# Patient Record
Sex: Male | Born: 1960 | Race: Black or African American | Hispanic: No | Smoking: Never smoker
Health system: Southern US, Community
[De-identification: ages and names within clinical notes are randomized; demographics above are authoritative.]

## PROBLEM LIST (undated history)

## (undated) DIAGNOSIS — S62309A Unspecified fracture of unspecified metacarpal bone, initial encounter for closed fracture: Secondary | ICD-10-CM

## (undated) DIAGNOSIS — I10 Essential (primary) hypertension: Secondary | ICD-10-CM

## (undated) DIAGNOSIS — N200 Calculus of kidney: Secondary | ICD-10-CM

## (undated) DIAGNOSIS — R569 Unspecified convulsions: Secondary | ICD-10-CM

## (undated) HISTORY — PX: NASAL SINUS SURGERY: SHX719

---

## 1998-06-08 ENCOUNTER — Emergency Department (HOSPITAL_COMMUNITY): Admission: EM | Admit: 1998-06-08 | Discharge: 1998-06-08 | Payer: Self-pay | Admitting: Emergency Medicine

## 1998-06-13 ENCOUNTER — Emergency Department (HOSPITAL_COMMUNITY): Admission: EM | Admit: 1998-06-13 | Discharge: 1998-06-13 | Payer: Self-pay | Admitting: Emergency Medicine

## 1998-06-27 ENCOUNTER — Ambulatory Visit (HOSPITAL_COMMUNITY): Admission: RE | Admit: 1998-06-27 | Discharge: 1998-06-27 | Payer: Self-pay | Admitting: Obstetrics and Gynecology

## 2005-05-01 ENCOUNTER — Emergency Department (HOSPITAL_COMMUNITY): Admission: EM | Admit: 2005-05-01 | Discharge: 2005-05-01 | Payer: Self-pay | Admitting: Emergency Medicine

## 2005-05-02 ENCOUNTER — Inpatient Hospital Stay (HOSPITAL_COMMUNITY): Admission: EM | Admit: 2005-05-02 | Discharge: 2005-05-19 | Payer: Self-pay | Admitting: Emergency Medicine

## 2005-05-02 ENCOUNTER — Encounter (INDEPENDENT_AMBULATORY_CARE_PROVIDER_SITE_OTHER): Payer: Self-pay | Admitting: *Deleted

## 2005-05-07 ENCOUNTER — Encounter: Payer: Self-pay | Admitting: *Deleted

## 2007-03-19 ENCOUNTER — Emergency Department (HOSPITAL_COMMUNITY): Admission: EM | Admit: 2007-03-19 | Discharge: 2007-03-19 | Payer: Self-pay | Admitting: Emergency Medicine

## 2010-08-09 ENCOUNTER — Encounter: Payer: Self-pay | Admitting: Family Medicine

## 2010-12-04 NOTE — Procedures (Signed)
EEG NUMBER:  [id number]   REFERRING PHYSICIAN:  Derenda Mis, M.D.   CLINICAL HISTORY:  This is a 50 year old male with short-term memory loss,  confusion and seizures.  Patient has been evaluated for possible herpes  encephalitis.   MEDICATIONS LISTED:  Lovenox, Seroquel, Mycostatin, Tylenol, Depakote,  Ativan.   DESCRIPTION:  This is a 17-channel portable EEG recorded with the patient  described as awake and confused.  The background awake rhythm consists of 9-  10 Hz alpha which is of moderate amplitude, synchronous, reactive to eye  opening and closure.  High frequency low amplitude beta activity is seen  throughout the tracing may be a medication effect.  No paroxysmal  epileptiform activity, spikes or sharp waves are seen.  No periodic  lateralized epileptiform discharges are noted either.  There is mild  intermittent theta and delta slowing seen diffusely intermittently.  Length  of the tracing is 20.5 minutes.  Technical component is average.  EKG  tracing reveals sinus rhythm.  Hyperventilation and photic stimulation are  not performed.   IMPRESSION:  This EEG performed in wakeful states is mildly abnormal due to  mild bihemispheric slowing which is a nonspecific finding.  No definite  epileptiform activity or periodic lateralized epileptiform discharges are  seen.  Excessive beta activity seen which may be medication effect.           ______________________________  Sunny Schlein. Pearlean Brownie, MD     FAO:ZHYQ  D:  05/04/2005 17:39:00  T:  05/04/2005 21:39:42  Job #:  [id number]

## 2010-12-04 NOTE — Procedures (Signed)
EEG:  [id number].   CLINICAL HISTORY:  A 49 year old with severe memory loss and five to six  seizure. Study is being done to look for presence of seizure disorder.   PROCEDURE:  The tracing is carried out on a 32-digital Cadwell recorder  reformatted into 16 channel montages with one devoted to EKG. The patient  was awake during the recording. Medications include Ativan, Depakote,  Seroquel, Lovenox. International 10/20 system lead placement was used.   DESCRIPTION OF FINDINGS:  Background is a continuous mixture of 10-11 Hz, 20  microvolt alpha range activity with superimposed generalized 13 Hz beta  range activity of similar voltage.   The patient does not change state of arousal. There is no focal slowing.  There is no interictal epileptiform activity in the form of spikes or sharp  waves. Activating procedures were not carried out. EKG showed a regular  sinus rhythm with ventricular response of 96 beats per minute.   IMPRESSION:  Essentially normal EEG, excessive beta range activity related  to use of Ativan. No evidence of focal slowing or seizures.      Deanna Artis. Sharene Skeans, M.D.  Electronically Signed     EAV:[id number]  D:  05/03/2005 21:49:10  T:  05/04/2005 00:11:58  Job #:  [id number]   cc:   Melissa L. Ladona Ridgel, MD   Melvyn Novas, M.D.  Fax: [phone]

## 2010-12-04 NOTE — H&P (Signed)
[name], [name] NO.:  [id number]   MEDICAL RECORD NO.:  [id number]          PATIENT TYPE:  INP   LOCATION:  2906                         FACILITY:  [facility]   PHYSICIAN:  Sherin Quarry, MD      DATE OF BIRTH:  07/13/1961   DATE OF ADMISSION:  05/02/2005  DATE OF DISCHARGE:                                HISTORY & PHYSICAL   HISTORY OF PRESENT ILLNESS:  John Mcgee is a 50 year old man who  presents to [facility] Emergency Room tonight after being witnessed to have  several seizures. These seizures were described as generalized tonic clonic  movements associated with loss of bladder control. On arrival to the  emergency room, he was initially given 2 mg of IV Ativan and this dosage was  repeated x1. The patient continued to have brief episodes of seizure  activity and therefore at 2 a.m. he was given 1 g of Dilantin as a loading  dose. Laboratory studies were obtained. These included alcohol level which  was negative, electrolytes which were notable for acidosis, blood sugar of  191, creatinine of 1.9. Liver functions were normal. Hemoglobin was 13.8.  Urinalysis was notable for large blood which may have been related to  placing the Foley catheter. Drug screen was negative. At this point the  patient is postictal and is somewhat agitated. Past medical history is very  significant. The patient's wife indicates that he may have been experiencing  memory loss for as long as 5 years. During that period of time he would  often state that he was worried that he would become lost if he would go  somewhere by himself. He had difficulty holding a job apparently because of  difficulty recalling detailed information. They had most recently been  living in [place] for 2 years before coming back to [place] in April. On  September 28, he was fired from his job possibly because of difficulty with  memory. Since that time his wife has observed that he would tend to say  things over and over again as if he did not remember he had said them. He  has complained on a couple of occasions of a dull headache. She has also  noted that he has been increasingly angry and aggressive toward her and the  children and has experienced mood swings. The wife brought him to see Dr.  Theresia Lo who performed blood testing which was negative. MRI scan of the  brain was obtained and this was reportedly normal. The patient went to a  neurologist in [facility]. The neurologist reviewed the MRI scan, ordered a  B12 and folate and placed him on Razadyne 8 mg daily. To the best of his  wife's knowledge he has not had any visual problems, he has not been falling  or ataxic, he has not had any breathing difficulty, chest pain, nausea or  vomiting. He will drink no more than one beer per day. He does not use drugs  and has never used drugs. He does not take any medications except those  described. 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 He has told his wife  in the past this involves exposure to some toxic materials.   PAST MEDICAL HISTORY:  He does not generally take any medications. He has no  known drug allergies. To the best of his wife's knowledge the only  significant surgical procedures he has had was a lithotripsy and also about  a year ago he had a large lipoma removed from his neck in [place]. She is  not aware of him having any other medical illnesses.   FAMILY HISTORY:  He has eight brothers and sisters one of whom has a seizure  history. His mother and father are both deceased from heart disease. He has  two children ages three and six.   SOCIAL HISTORY:  See above.   REVIEW OF SYSTEMS:  Head - He denies headache or dizziness; there is no  history of visual blurring; no history of tinnitus. There is no history of  breathing difficulty or chest pain. No history of nausea, vomiting or  abdominal pain. No history of diarrhea. No history of dysuria.   PHYSICAL  EXAMINATION:  GENERAL:  On physical exam he is a confused gentleman  who is moving his arms and legs. He is somewhat agitated. He has been  sedated with Ativan.  HEENT:  Exam is within normal limits. The pupils are equal and reactive.  There is no meningismus.  CHEST:  Clear.  BACK:  Examination the back reveals no CVA or point tenderness.  CARDIOVASCULAR:  Exam reveals normal S1-S2 without rubs, murmurs or gallops.  ABDOMEN:  The abdomen is benign.  NEUROLOGIC:  On neurologic testing cranial nerves, motor, sensory and  cerebellar testing is within normal limits grossly.  EXTREMITIES:  Examination of extremities reveals no evidence of  cyanosis or  edema.   IMPRESSION:  1.  New onset of recurrent seizures, status epilepticus.  2.  Five-year history of memory loss and behavior change as described of      unknown etiology.  3.  History of kidney stones.   PLAN:  The plan will be to obtain repeat MRI scan. Note that a CT scan has  already been obtained. Lumbar puncture will be obtained and fluid will be  sent for appropriate studies - T4, RPR, B12, folate, HIV serologies. The  patient should probably also have 24 urine's for poriferans and heavy  metals. Neurology consult will be obtained. We will continue Dilantin and  follow levels closely. We will administer Ativan as necessary to achieve  adequate sedation.           ______________________________  Sherin Quarry, MD     SY/MEDQ  D:  05/02/2005  T:  05/02/2005  Job:  [id number]   cc:   Vikki Ports, M.D.  Fax: [phone]
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
## 2010-12-04 NOTE — Consult Note (Signed)
NAMEPRATHAM, CASSATT               ACCOUNT NO.:  [id number]   MEDICAL RECORD NO.:  [id number]          PATIENT TYPE:  INP   LOCATION:  2906                         FACILITY:  [facility]   PHYSICIAN:  Melvyn Novas, M.D.  DATE OF BIRTH:  Jun 18, 1961   DATE OF CONSULTATION:  05/02/2005  DATE OF DISCHARGE:                                   CONSULTATION   REASON FOR CONSULTATION:  The patient was admitted on May 02, 2005 at  4:40 a.m. after having suffered 4 or 5 seizures, some of them observed by  the ER physician, Dr. Preston Fleeting.  Mr. Chase Picket has a history of memory loss, but  recently became more evident and has concerned his wife and family.  He had  one Friday prior to his admission here on Sunday morning and evaluation at  [facility] (Dr. Jesse Sans) and the patient's family reports that a  mini-mental test was done; but they heard no results and no comments of what  it showed.  The patient's progressive memory loss might have started as long  as 2 years ago and recently he deteriorated further; had visual  hallucinations, reported he hears engines and moving parts in the room, and  especially on the ceiling; he also had auditory hallucinations.  According  to his wife, consultations have never taken place.  The wife brought him to  the ER already on Saturday and apparently at that time he was just confused  and appeared to the ER physician demented.  After a blood test was obtained  he developed seizures, and then instead of being discharged home was  admitted to the [facility]. He was treated by Dr. Preston Fleeting with 4 mg of  Ativan and a gram of Dilantin IV; appears still somewhat somnolent and  postictal. He is drowsy and difficult to arouse, but pleasant and in no  acute distress. His vital signs are stable. The patient's CT in the ER was  normal. The patient had an MRI that was done with [facility] at [facility],  [place] -- reportedly was normal, although  I do not have a  written report nor  access to the pictures.   PHYSICAL EXAMINATION:  LUNGS:  Lungs are clear to auscultation.  CARDIAC:  Regular rate and rhythm.  HEENT:  The patient's temporal arteries are not impaired. He does not have  signs of a tongue bite at this point.  NECK:  Supple.  NEUROLOGIC:  Again, he is arousable but only after sternal rub and/or pinch.  Pupils react equal to light and accommodation. He has full extraocular  movements. No facial asymmetry. No tongue deviation. The uvula is midline.  The speech is clear and fluent, but the patient fell asleep multiple times  during this exam. He shows normal motor function with full grip strength  bilaterally. Both arms can be lifted against gravity with equal strength,  tone and mass. Deep tendon reflexes are 1+. There was no clonus, no  spasticity.  Downgoing toes to plantar stimulation. Sensory shows full touch  sensation to pinprick and vibratory sense. Coordination was also tested, but  the patient fell asleep when I explained the finger-nose test to him. I  could arouse him so far as to try to move with his finger my moving finger  and cooperated well with that.  He still seems to be very sedated.   LABORATORY DATA:  His labs have returned; White blood cell count of 17.4.  Chem-7 was normal, except for elevated creatinine of 1.6. CSF showed  multiple red blood cells but no white blood cells.  This traumatic tap might  be responsible for the elevated glucose and protein.  The patient appears to  be not fully aware of his surroundings, and defers all questions and medical  information to his wife.   FAMILY HISTORY:  Apparently is negative for mental illness or neurologic  degenerative illnesses; also there is no history of seizures.   SOCIAL HISTORY:  The patient is married and lives with his wife; family is  in the area. He is not on medications for hallucination at this point.  His  primary care service, the hospital team  continued Ativan and Dilantin.   ASSESSMENT:  1.  The patient is oversedated or is still postictal.  EEG will be ordered      for Monday. There is no acute local mental status change since the      admission yesterday. I would like to switch him from an IV Dilantin to      Depakote 1 gram a day divided in 500 mg cm b.i.d. This might also help      his psychiatric illness which are strongly suspect is present.  I would      like to add Seroquel 100 mg q.h.s. and obtain a psychiatric consult with      Dr. Antonietta Breach.  The patient has visual and auditory      hallucinations; we have to further evaluate him for substance abuse,      although a toxicology screen in the ER was negative.  I expect a      discharge or transfer to mental health within 48 hours,           ______________________________  Melvyn Novas, M.D.     CD/MEDQ  D:  05/02/2005  T:  05/02/2005  Job:  [id number]

## 2012-01-10 ENCOUNTER — Other Ambulatory Visit: Payer: Self-pay | Admitting: Nephrology

## 2012-02-26 ENCOUNTER — Emergency Department (HOSPITAL_COMMUNITY)
Admission: EM | Admit: 2012-02-26 | Discharge: 2012-02-26 | Disposition: A | Discharge status: Home / Self Care | Payer: 59 | Attending: Emergency Medicine | Admitting: Emergency Medicine

## 2012-02-26 ENCOUNTER — Emergency Department (HOSPITAL_COMMUNITY): Payer: 59

## 2012-02-26 ENCOUNTER — Encounter (HOSPITAL_COMMUNITY): Payer: Self-pay | Admitting: *Deleted

## 2012-02-26 DIAGNOSIS — R51 Headache: Secondary | ICD-10-CM

## 2012-02-26 DIAGNOSIS — I10 Essential (primary) hypertension: Secondary | ICD-10-CM | POA: Insufficient documentation

## 2012-02-26 DIAGNOSIS — R42 Dizziness and giddiness: Secondary | ICD-10-CM | POA: Insufficient documentation

## 2012-02-26 HISTORY — DX: Unspecified convulsions: R56.9

## 2012-02-26 LAB — URINALYSIS, ROUTINE W REFLEX MICROSCOPIC
Bilirubin Urine: NEGATIVE
Glucose, UA: NEGATIVE mg/dL
Hgb urine dipstick: NEGATIVE
Ketones, ur: 15 mg/dL — AB
Nitrite: NEGATIVE
Protein, ur: NEGATIVE mg/dL
Specific Gravity, Urine: 1.031 — ABNORMAL HIGH (ref 1.005–1.030)
Urobilinogen, UA: 1 mg/dL (ref 0.0–1.0)
pH: 6 (ref 5.0–8.0)

## 2012-02-26 LAB — CBC WITH DIFFERENTIAL/PLATELET
Basophils Absolute: 0 10*3/uL (ref 0.0–0.1)
Basophils Relative: 0 % (ref 0–1)
Eosinophils Absolute: 0.1 10*3/uL (ref 0.0–0.7)
Eosinophils Relative: 1 % (ref 0–5)
HCT: 44.6 % (ref 39.0–52.0)
Lymphs Abs: 2.1 10*3/uL (ref 0.7–4.0)
MCH: 29.5 pg (ref 26.0–34.0)
MCHC: 33 g/dL (ref 30.0–36.0)
MCV: 89.6 fL (ref 78.0–100.0)
Monocytes Absolute: 0.6 10*3/uL (ref 0.1–1.0)
Monocytes Relative: 7 % (ref 3–12)
Neutro Abs: 6 10*3/uL (ref 1.7–7.7)
Neutrophils Relative %: 68 % (ref 43–77)
Platelets: 288 10*3/uL (ref 150–400)
RBC: 4.98 MIL/uL (ref 4.22–5.81)

## 2012-02-26 LAB — POCT I-STAT TROPONIN I: Troponin i, poc: 0 ng/mL (ref 0.00–0.08)

## 2012-02-26 LAB — POCT I-STAT, CHEM 8
BUN: 15 mg/dL (ref 6–23)
Calcium, Ion: 1.2 mmol/L (ref 1.12–1.23)
Chloride: 105 mEq/L (ref 96–112)
Creatinine, Ser: 1.1 mg/dL (ref 0.50–1.35)
Hemoglobin: 15 g/dL (ref 13.0–17.0)
Sodium: 142 mEq/L (ref 135–145)
TCO2: 23 mmol/L (ref 0–100)

## 2012-02-26 MED ORDER — HYDROCHLOROTHIAZIDE 25 MG PO TABS: 25.0000 mg | ORAL_TABLET | Freq: Every day | ORAL | Status: DC

## 2012-02-26 MED ORDER — SODIUM CHLORIDE 0.9 % IV BOLUS (SEPSIS): 1000.0000 mL | Freq: Once | INTRAVENOUS | Status: DC

## 2012-02-26 NOTE — ED Notes (Signed)
Pt given turkey sandwich

## 2012-02-26 NOTE — ED Provider Notes (Signed)
History     CSN: [id number]  Arrival date & time 02/26/12  1433   First MD Initiated Contact with Patient 02/26/12 1948      Chief Complaint  Patient presents with  . Dizziness  . Hypertension    (Consider location/radiation/quality/duration/timing/severity/associated sxs/prior treatment) Patient is a 51 y.o. male presenting with headaches. The history is provided by the patient.  Headache  This is a new problem. Episode onset: 3 days ago. Episode frequency: intermittently. The problem has been rapidly improving. The headache is associated with nothing. The pain is located in the frontal region. The quality of the pain is described as dull. The pain is at a severity of 3/10. The pain is mild. The pain does not radiate. Pertinent negatives include no fever, no chest pressure, no near-syncope, no orthopnea, no palpitations, no shortness of breath, no nausea and no vomiting. He has tried nothing for the symptoms.    Past Medical History  Diagnosis Date  . Seizures     History reviewed. No pertinent past surgical history.  No family history on file.  History  Substance Use Topics  . Smoking status: Never Smoker   . Smokeless tobacco: Not on file  . Alcohol Use: Yes     occ      Review of Systems  Constitutional: Negative for fever and chills.  HENT: Negative for congestion and rhinorrhea.   Respiratory: Negative for cough and shortness of breath.   Cardiovascular: Negative for chest pain, palpitations, orthopnea and near-syncope.  Gastrointestinal: Negative for nausea, vomiting, abdominal pain and constipation.  Genitourinary: Negative for dysuria, frequency and decreased urine volume.  Musculoskeletal: Negative for back pain.  Skin: Negative.   Neurological: Positive for dizziness (once, a couple days ago) and headaches. Negative for seizures, syncope, weakness and numbness.  Psychiatric/Behavioral: Negative for confusion.  All other systems reviewed and are  negative.    Allergies  Review of patient's allergies indicates no known allergies.  Home Medications   Current Outpatient Rx  Name Route Sig Dispense Refill  . QUETIAPINE FUMARATE 25 MG PO TABS Oral Take 25 mg by mouth at bedtime.    Marland Kitchen SILDENAFIL CITRATE 100 MG PO TABS Oral Take 100 mg by mouth daily as needed. Erectile dysfunction      BP 140/106  Pulse 81  Temp 98.1 F (36.7 C) (Oral)  Resp 18  SpO2 99%  Physical Exam  Nursing note and vitals reviewed. Constitutional: He is oriented to person, place, and time. He appears well-developed and well-nourished.  HENT:  Head: Normocephalic and atraumatic.  Right Ear: External ear normal.  Left Ear: External ear normal.  Nose: Nose normal.  Eyes: EOM are normal. Pupils are equal, round, and reactive to light.  Neck: Neck supple.  Cardiovascular: Normal rate, regular rhythm, normal heart sounds and intact distal pulses.   No murmur heard. Pulmonary/Chest: Effort normal and breath sounds normal.  Abdominal: Soft. He exhibits no distension. There is no tenderness.  Musculoskeletal: He exhibits no edema.  Lymphadenopathy:    He has no cervical adenopathy.  Neurological: He is alert and oriented to person, place, and time. He has normal strength and normal reflexes. No cranial nerve deficit or sensory deficit. He exhibits normal muscle tone. He displays a negative Romberg sign. Coordination and gait normal. GCS eye subscore is 4. GCS verbal subscore is 5. GCS motor subscore is 6. He displays no Babinski's sign on the right side. He displays no Babinski's sign on the left side.  Finger to nose, RAM normal. Normal gait  Skin: Skin is warm and dry.    ED Course  Procedures (including critical care time)  Labs Reviewed  URINALYSIS, ROUTINE W REFLEX MICROSCOPIC - Abnormal; Notable for the following:    Specific Gravity, Urine 1.031 (*)     Ketones, ur 15 (*)     All other components within normal limits  POCT I-STAT, CHEM 8  - Abnormal; Notable for the following:    Glucose, Bld 135 (*)     All other components within normal limits  CBC WITH DIFFERENTIAL  POCT I-STAT TROPONIN I   Ct Head Wo Contrast  02/26/2012  *RADIOLOGY REPORT*  Clinical Data: Dizziness, headache  CT HEAD WITHOUT CONTRAST  Technique:  Contiguous axial images were obtained from the base of the skull through the vertex without contrast.  Comparison: Brain MRI 05/02/2005  Findings: No acute intracranial hemorrhage.  No focal mass lesion. No CT evidence of acute infarction.   No midline shift or mass effect.  No hydrocephalus.  Basilar cisterns are patent. Paranasal sinuses and mastoid air cells are clear.  Orbits are normal.  IMPRESSION: No acute intracranial findings.  Original Report Authenticated By: Genevive Bi, M.D.     1. Headache   2. Hypertension       MDM  Intermittent mild headaches over past couple days. 1 episode of dizziness while walking. Now had mild headache but normal neuro exam. Mild HTN in 140s systolic. No chest pain, blurry vision or urinary changes or dyspnea to suggest end organ damage. Labs benign except mild hyperglycemia. Discussed oral hydration based on mild ketones in urine. No evidence of acute intracranial process based on gradual and intermittent onset with normal appearing patient without distress and normal neuro exam. Discussed return precautions, and due to HTN will start on HCTZ and encouraged close PCP follow up for HTN management.         Pricilla Loveless, MD 02/26/12 [phone]

## 2012-02-26 NOTE — ED Provider Notes (Signed)
I saw and evaluated the patient, reviewed the resident's note and I agree with the findings and plan. He states that he has not been feeling well for a few days, so, he checked his blood pressure.  It was elevated.  He checked the Internet and found out that there can be complications of high blood pressure, so, he presented to the emergency department.  He denies pain anywhere.  He denies vision changes, nausea, vomiting.  He has not had a cough, or shortness of breath, and he denies changes with his urination.  His physical examination is normal.  He has mildly elevated.  Blood pressure.  There is no evidence of endorgan damage.  We will release him with a mild diuretic to follow up with his primary care physician  Cheri Guppy, MD 02/26/12 2148

## 2012-02-26 NOTE — ED Notes (Signed)
Pt ambulated with a  Steady gait; VSS; A&Ox3; no signs of distres; respirations even and unlabored; skin warm and dry; no questions at this time.

## 2012-02-26 NOTE — ED Notes (Signed)
Pt has had dizziness since Thursday and found out yesterday that he had high blood pressure.  Pt was given potassium supplement for low potassium and got a blood pressure medication.  Pt states worse today.  Pt reported some sweating last nite.  No extremity or facial deficits but feels like thinking is slower

## 2012-02-27 NOTE — ED Provider Notes (Signed)
I saw and evaluated the patient, reviewed the resident's note and I agree with the findings and plan.  [name], MD 02/27/12 0014 

## 2012-05-28 ENCOUNTER — Encounter (HOSPITAL_BASED_OUTPATIENT_CLINIC_OR_DEPARTMENT_OTHER): Payer: Self-pay | Admitting: *Deleted

## 2012-05-28 ENCOUNTER — Emergency Department (HOSPITAL_BASED_OUTPATIENT_CLINIC_OR_DEPARTMENT_OTHER)
Admission: EM | Admit: 2012-05-28 | Discharge: 2012-05-28 | Disposition: A | Discharge status: Home / Self Care | Payer: 59 | Attending: Emergency Medicine | Admitting: Emergency Medicine

## 2012-05-28 DIAGNOSIS — M545 Low back pain, unspecified: Secondary | ICD-10-CM | POA: Insufficient documentation

## 2012-05-28 DIAGNOSIS — G40909 Epilepsy, unspecified, not intractable, without status epilepticus: Secondary | ICD-10-CM | POA: Insufficient documentation

## 2012-05-28 DIAGNOSIS — Z79899 Other long term (current) drug therapy: Secondary | ICD-10-CM | POA: Insufficient documentation

## 2012-05-28 MED ORDER — CYCLOBENZAPRINE HCL 10 MG PO TABS: 10.0000 mg | ORAL_TABLET | Freq: Three times a day (TID) | ORAL | Status: DC | PRN

## 2012-05-28 NOTE — ED Provider Notes (Signed)
History    This chart was scribed for Geoffery Lyons, MD, MD by Smitty Pluck, ED Scribe. The patient was seen in room MH09 and the patient's care was started at 4:46PM.   CSN: [id number]  Arrival date & time 05/28/12  1623      Chief Complaint  Patient presents with  . Back Pain    (Consider location/radiation/quality/duration/timing/severity/associated sxs/prior treatment) The history is provided by the patient. No language interpreter was used.   John Mcgee is a 51 y.o. male who presents to the Emergency Department complaining of constant, moderate lower back pain onset 1 day ago. He reports radiating to his legs bilaterally. Reports symptoms have worsening since onset. Pain is aggravated by movement. Pt reports that he has hx of lower back pain. Pt has taken aleve and used icy/hot pads without relief. He reports lifting weight for past two months and running but this should not have caused current back pain. Denies injury to back, hx of back surgeries, bowel and urinary incontinence.   Past Medical History  Diagnosis Date  . Seizures     History reviewed. No pertinent past surgical history.  No family history on file.  History  Substance Use Topics  . Smoking status: Never Smoker   . Smokeless tobacco: Not on file  . Alcohol Use: Yes     Comment: occ      Review of Systems  All other systems reviewed and are negative.  10 Systems reviewed and all are negative for acute change except as noted in the HPI.    Allergies  Review of patient's allergies indicates no known allergies.  Home Medications   Current Outpatient Rx  Name  Route  Sig  Dispense  Refill  . QUETIAPINE FUMARATE 25 MG PO TABS   Oral   Take 25 mg by mouth at bedtime.         Marland Kitchen HYDROCHLOROTHIAZIDE 25 MG PO TABS   Oral   Take 1 tablet (25 mg total) by mouth daily.   14 tablet   0   . SILDENAFIL CITRATE 100 MG PO TABS   Oral   Take 100 mg by mouth daily as needed. Erectile  dysfunction           BP 123/91  Pulse 82  Temp 98.4 F (36.9 C) (Oral)  Resp 18  Ht 5\' 8"  (1.727 m)  Wt 190 lb (86.183 kg)  BMI 28.89 kg/m2  SpO2 99%  Physical Exam  Nursing note and vitals reviewed. Constitutional: He is oriented to person, place, and time. He appears well-developed and well-nourished. No distress.  HENT:  Head: Normocephalic and atraumatic.  Eyes: EOM are normal. Pupils are equal, round, and reactive to light.  Neck: Normal range of motion. Neck supple. No tracheal deviation present.  Pulmonary/Chest: Effort normal. No respiratory distress.  Abdominal: Soft. He exhibits no distension.  Musculoskeletal: Normal range of motion.       Tender to palpation in lumbar spine soft tissue   Neurological: He is alert and oriented to person, place, and time.       DTRs are +1 and equal Equal bilateral lower extremities 5/5 strength bilateral lower extremities Able to walk on toes without difficulty   Skin: Skin is warm and dry.  Psychiatric: He has a normal mood and affect. His behavior is normal.    ED Course  Procedures (including critical care time) DIAGNOSTIC STUDIES: Oxygen Saturation is 99% on room air, normal by my interpretation.  COORDINATION OF CARE: 4:52 PM Discussed ED treatment with pt     Labs Reviewed - No data to display No results found.   No diagnosis found.    MDM  Will treat with flexeril and nsaids.  There are no signs or symptoms concerning for cauda equina or other emergent pathology.  To follow up with pcp if not improving in one week.      I personally performed the services described in this documentation, which was scribed in my presence. The recorded information has been reviewed and is accurate.      Geoffery Lyons, MD 05/29/12 [phone]

## 2012-05-28 NOTE — ED Notes (Addendum)
Pt c/o low back pain x 1 day- denies specific injury- has been using aleve and otc pain patches without relief

## 2012-06-07 ENCOUNTER — Encounter (HOSPITAL_BASED_OUTPATIENT_CLINIC_OR_DEPARTMENT_OTHER): Payer: Self-pay | Admitting: *Deleted

## 2012-06-07 ENCOUNTER — Emergency Department (HOSPITAL_BASED_OUTPATIENT_CLINIC_OR_DEPARTMENT_OTHER): Payer: 59

## 2012-06-07 ENCOUNTER — Emergency Department (HOSPITAL_BASED_OUTPATIENT_CLINIC_OR_DEPARTMENT_OTHER)
Admission: EM | Admit: 2012-06-07 | Discharge: 2012-06-07 | Disposition: A | Discharge status: Home / Self Care | Payer: 59 | Attending: Emergency Medicine | Admitting: Emergency Medicine

## 2012-06-07 DIAGNOSIS — R109 Unspecified abdominal pain: Secondary | ICD-10-CM

## 2012-06-07 DIAGNOSIS — Z79899 Other long term (current) drug therapy: Secondary | ICD-10-CM | POA: Insufficient documentation

## 2012-06-07 DIAGNOSIS — Z87442 Personal history of urinary calculi: Secondary | ICD-10-CM | POA: Insufficient documentation

## 2012-06-07 HISTORY — DX: Calculus of kidney: N20.0

## 2012-06-07 LAB — BASIC METABOLIC PANEL
Calcium: 10.2 mg/dL (ref 8.4–10.5)
Chloride: 100 mEq/L (ref 96–112)
Creatinine, Ser: 1 mg/dL (ref 0.50–1.35)
GFR calc Af Amer: >90 mL/min (ref >90)
Sodium: 140 mEq/L (ref 135–145)

## 2012-06-07 LAB — URINALYSIS, ROUTINE W REFLEX MICROSCOPIC
Ketones, ur: 15 mg/dL — AB
Leukocytes, UA: NEGATIVE
Nitrite: NEGATIVE
Protein, ur: NEGATIVE mg/dL
pH: 6.5 (ref 5.0–8.0)

## 2012-06-07 MED ORDER — ONDANSETRON HCL 4 MG/2ML IJ SOLN
4.0000 mg | Freq: Once | INTRAMUSCULAR | Status: AC
  Administered 2012-06-07: 4 mg via INTRAVENOUS
  Filled 2012-06-07: qty 2

## 2012-06-07 MED ORDER — HYDROCODONE-ACETAMINOPHEN 5-325 MG PO TABS: 2.0000 | ORAL_TABLET | ORAL | Status: DC | PRN

## 2012-06-07 MED ORDER — HYDROMORPHONE HCL PF 1 MG/ML IJ SOLN
1.0000 mg | Freq: Once | INTRAMUSCULAR | Status: AC
  Administered 2012-06-07: 1 mg via INTRAVENOUS
  Filled 2012-06-07: qty 1

## 2012-06-07 NOTE — ED Notes (Signed)
Pt amb to room 8 with quick steady gait in nad.  Pt reports his usual kidney stone pain to right flank radiating to groin x 1 week. Denies any hematuria, dysuria or fevers.

## 2012-06-07 NOTE — ED Notes (Signed)
Pt refuses labs and iv start. Pt states "I'm a very hard stick, let's just see what the doctor thinks.Marland KitchenMarland Kitchen"

## 2012-06-07 NOTE — ED Provider Notes (Signed)
Medical screening examination/treatment/procedure(s) were performed by non-physician practitioner and as supervising physician I was immediately available for consultation/collaboration.  Jones Skene, M.D.     Jones Skene, MD 06/07/12 1519

## 2012-06-07 NOTE — ED Provider Notes (Signed)
History     CSN: [id number]  Arrival date & time 06/07/12  1202   First MD Initiated Contact with Patient 06/07/12 1229      Chief Complaint  Patient presents with  . Flank Pain    (Consider location/radiation/quality/duration/timing/severity/associated sxs/prior treatment) HPI Comments: Pt state that the pain has been going on for close to 10 days:pt state that he was seen about 1 week ago because he thought it was muscle strain:pt states that he hasn't had a stone in 10 years but he realized that this is similar pain:pt states that his flow seems weekend:pt is having right flank pain with radiation to the groin  Patient is a 51 y.o. male presenting with flank pain. The history is provided by the patient. No language interpreter was used.  Flank Pain This is a recurrent problem. The current episode started today. The problem occurs intermittently. The problem has been unchanged. Pertinent negatives include no fever, numbness, vomiting or weakness. Nothing aggravates the symptoms. He has tried NSAIDs for the symptoms.    Past Medical History  Diagnosis Date  . Seizures   . Kidney stones     History reviewed. No pertinent past surgical history.  History reviewed. No pertinent family history.  History  Substance Use Topics  . Smoking status: Never Smoker   . Smokeless tobacco: Not on file  . Alcohol Use: Yes     Comment: occ      Review of Systems  Constitutional: Negative for fever.  Respiratory: Negative.   Cardiovascular: Negative.   Gastrointestinal: Negative for vomiting.  Genitourinary: Positive for flank pain.  Neurological: Negative for weakness and numbness.    Allergies  Review of patient's allergies indicates no known allergies.  Home Medications   Current Outpatient Rx  Name  Route  Sig  Dispense  Refill  . CYCLOBENZAPRINE HCL 10 MG PO TABS   Oral   Take 1 tablet (10 mg total) by mouth 3 (three) times daily as needed for muscle spasms.   20  tablet   0   . HYDROCHLOROTHIAZIDE 25 MG PO TABS   Oral   Take 1 tablet (25 mg total) by mouth daily.   14 tablet   0   . QUETIAPINE FUMARATE 25 MG PO TABS   Oral   Take 25 mg by mouth at bedtime.         Marland Kitchen SILDENAFIL CITRATE 100 MG PO TABS   Oral   Take 100 mg by mouth daily as needed. Erectile dysfunction           BP 142/88  Pulse 92  Temp 97.7 F (36.5 C) (Oral)  Resp 16  Ht 5' 8.5" (1.74 m)  Wt 180 lb (81.647 kg)  BMI 26.97 kg/m2  SpO2 97%  Physical Exam  Nursing note and vitals reviewed. Constitutional: He is oriented to person, place, and time. He appears well-developed and well-nourished. No distress.  HENT:  Head: Normocephalic and atraumatic.  Cardiovascular: Normal rate and regular rhythm.   Pulmonary/Chest: Effort normal and breath sounds normal.  Abdominal: Soft. Bowel sounds are normal. There is no tenderness. There is no CVA tenderness.  Neurological: He is alert and oriented to person, place, and time.  Skin: Skin is warm and dry.  Psychiatric: He has a normal mood and affect.    ED Course  Procedures (including critical care time)  Labs Reviewed  URINALYSIS, ROUTINE W REFLEX MICROSCOPIC - Abnormal; Notable for the following:    Ketones, ur 15 (*)  All other components within normal limits  BASIC METABOLIC PANEL - Abnormal; Notable for the following:    Glucose, Bld 102 (*)     GFR calc non Af Amer 84 (*)     All other components within normal limits   Ct Abdomen Pelvis Wo Contrast  06/07/2012  *RADIOLOGY REPORT*  Clinical Data:  Right-sided flank pain with history of kidney stones.  CT ABDOMEN AND PELVIS WITHOUT CONTRAST (CT UROGRAM)  Technique: Contiguous axial images of the abdomen and pelvis without oral or intravenous contrast were obtained.  Comparison: 05/06/2005  Findings:  Exam is limited for evaluation of entities other than urinary tract calculi due to lack of oral or intravenous contrast.   Lung bases:  Mild volume loss and  bronchiectasis within the lingula, likely post infectious or inflammatory.  Normal heart size without pericardial or pleural effusion.  Abdomen/pelvis:  Normal uninfused appearance of the liver, spleen, stomach, pancreas, gallbladder, biliary tract, adrenal glands.  No renal calculi or hydronephrosis.  Vascular calcifications within the pelvis. No hydroureter or ureteric calculi.  No retroperitoneal or retrocrural adenopathy.  Normal colon, appendix, and terminal ileum.  Normal small bowel without abdominal ascites.    No pelvic adenopathy.  Normal urinary bladder.  Prostatic calcifications. No significant free fluid.  Bones/Musculoskeletal:  Partial fusion of the bilateral sacroiliac joint which is likely degenerative.  IMPRESSION: No urinary tract calculi or hydronephrosis.  No other explanation for right-sided pain.   Original Report Authenticated By: Jeronimo Greaves, M.D.      1. Flank pain       MDM  No sign of stone:urine is clean:will have pt follow up with sport medicine        Teressa Lower, NP 06/07/12 1453

## 2012-06-23 ENCOUNTER — Ambulatory Visit: Payer: 59 | Admitting: Family Medicine

## 2013-10-11 ENCOUNTER — Ambulatory Visit: Payer: Self-pay | Admitting: *Deleted

## 2013-10-11 DIAGNOSIS — R55 Syncope and collapse: Secondary | ICD-10-CM

## 2014-05-21 ENCOUNTER — Encounter: Payer: Self-pay | Admitting: *Deleted

## 2015-01-21 ENCOUNTER — Encounter (HOSPITAL_BASED_OUTPATIENT_CLINIC_OR_DEPARTMENT_OTHER): Payer: Self-pay | Admitting: *Deleted

## 2015-01-21 ENCOUNTER — Emergency Department (HOSPITAL_BASED_OUTPATIENT_CLINIC_OR_DEPARTMENT_OTHER)
Admission: EM | Admit: 2015-01-21 | Discharge: 2015-01-21 | Disposition: A | Discharge status: Home / Self Care | Payer: Self-pay | Attending: Emergency Medicine | Admitting: Emergency Medicine

## 2015-01-21 DIAGNOSIS — R630 Anorexia: Secondary | ICD-10-CM | POA: Insufficient documentation

## 2015-01-21 DIAGNOSIS — Z87442 Personal history of urinary calculi: Secondary | ICD-10-CM | POA: Insufficient documentation

## 2015-01-21 DIAGNOSIS — G40909 Epilepsy, unspecified, not intractable, without status epilepticus: Secondary | ICD-10-CM | POA: Insufficient documentation

## 2015-01-21 DIAGNOSIS — R079 Chest pain, unspecified: Secondary | ICD-10-CM | POA: Insufficient documentation

## 2015-01-21 DIAGNOSIS — G4700 Insomnia, unspecified: Secondary | ICD-10-CM | POA: Insufficient documentation

## 2015-01-21 DIAGNOSIS — R42 Dizziness and giddiness: Secondary | ICD-10-CM | POA: Insufficient documentation

## 2015-01-21 DIAGNOSIS — Z79899 Other long term (current) drug therapy: Secondary | ICD-10-CM | POA: Insufficient documentation

## 2015-01-21 MED ORDER — LORAZEPAM 1 MG PO TABS: 1.0000 mg | ORAL_TABLET | Freq: Every day | ORAL | Status: DC

## 2015-01-21 NOTE — ED Provider Notes (Signed)
CSN: [id number]     Arrival date & time 01/21/15  2029 History  This chart was scribed for Mirian Mo, MD by Murriel Hopper, ED Scribe. This patient was seen in room MH03/MH03 and the patient's care was started at 8:58 PM.    Chief Complaint  Patient presents with  . Dizziness       Patient is a 54 y.o. male presenting with dizziness. The history is provided by the patient. No language interpreter was used.  Dizziness Quality:  Lightheadedness Severity:  Moderate Onset quality:  Gradual Duration:  1 day Timing:  Constant Progression:  Worsening Chronicity:  New Associated symptoms: chest pain and nausea   Associated symptoms: no vomiting   Chest pain:    Severity:  Mild Nausea:    Severity:  Mild   Onset quality:  Gradual   Duration:  1 day   HPI Comments: John Mcgee is a 54 y.o. 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 Pt reports he stayed up all night last night doing work and only slept for about an hour last night. Pt states he woke up this morning and felt dizzy, lightheaded, had intermittent chest pains all day when he was at work. Pt also reports having a lack of focus while at work today. Pt states he went into [employer] and took his blood pressure and noted that the machine told him his BP was so high that he needed to see his physician, which is why he came into ED tonight. Pt denies vomiting.      Past Medical History  Diagnosis Date  . Seizures   . Kidney stones    History reviewed. No pertinent past surgical history. No family history on file. History  Substance Use Topics  . Smoking status: Never Smoker   . Smokeless tobacco: Not on file  . Alcohol Use: Yes     Comment: occ    Review of Systems  Constitutional: Positive for appetite  change.  Cardiovascular: Positive for chest pain.  Gastrointestinal: Positive for nausea. Negative for vomiting.  Neurological: Positive for dizziness and light-headedness.  All other systems reviewed and are negative.     Allergies  Review of patient's allergies indicates no known allergies.  Home Medications   Prior to Admission medications   Medication Sig Start Date End Date Taking? Authorizing Provider  cyclobenzaprine (FLEXERIL) 10 MG tablet Take 1 tablet (10 mg total) by mouth 3 (three) times daily as needed for muscle spasms. 05/28/12   Geoffery Lyons, MD  hydrochlorothiazide (HYDRODIURIL) 25 MG tablet Take 1 tablet (25 mg total) by mouth daily. 02/26/12   Pricilla Loveless, MD  HYDROcodone-acetaminophen (NORCO/VICODIN) 5-325 MG per tablet Take 2 tablets by mouth every 4 (four) hours as needed for pain. 06/07/12   Teressa Lower, NP  LORazepam (ATIVAN) 1 MG tablet Take 1 tablet (1 mg total) by mouth at bedtime. 01/21/15   Mirian Mo, MD  QUEtiapine (SEROQUEL) 25 MG tablet Take 25 mg by mouth at bedtime.    Historical Provider, MD  sildenafil (VIAGRA) 100 MG tablet Take 100 mg by mouth daily as needed. Erectile dysfunction    Historical Provider, MD   BP 147/101 mmHg  Pulse 93  Temp(Src) 98 F (36.7 C) (Oral)  Resp 20  Wt 185 lb (83.915 kg)  SpO2 100% Physical Exam  Constitutional: He is oriented to person, place, and time. He appears well-developed and well-nourished.  HENT:  Head: Normocephalic and atraumatic.  Eyes: Conjunctivae and EOM are normal.  Neck: Normal range of motion. Neck supple.  Cardiovascular: Normal rate, regular rhythm and normal heart sounds.   Pulmonary/Chest: Effort normal and breath sounds normal. No respiratory distress.  Abdominal: He exhibits no distension. There is no tenderness. There is no rebound and no guarding.  Musculoskeletal: Normal range of motion.  Neurological: He is alert and oriented to person, place, and time. He has normal  strength and normal reflexes. No cranial nerve deficit or sensory deficit. Coordination and gait normal. GCS eye subscore is 4. GCS verbal subscore is 5. GCS motor subscore is 6.  Skin: Skin is warm and dry.  Vitals reviewed.   ED Course  Procedures (including critical care time)  DIAGNOSTIC STUDIES: Oxygen Saturation is 100% on room air, normal by my interpretation.    COORDINATION OF CARE: 9:03 PM Discussed treatment plan with pt at bedside and pt agreed to plan.   Labs Review Labs Reviewed - No data to display  Imaging Review No results found.   EKG Interpretation   Date/Time:  Tuesday January 21 2015 20:49:57 EDT Ventricular Rate:  83 PR Interval:  166 QRS Duration: 82 QT Interval:  362 QTC Calculation: 425 R Axis:   75 Text Interpretation:  Normal sinus rhythm Normal ECG No significant change  since last tracing Confirmed by Mirian MoGentry, Erma Joubert [phone]([id number]) on 01/21/2015  9:01:19 PM      MDM   Final diagnoses:  Insomnia  Lightheadedness    54 y.o. male with pertinent PMH of prior insomnia and seizures years ago presents with lightheadedness, malaise, and fatigue in context of not sleeping due to work.  Exam benign.  No historical elements of CVA, ACS, or other emergent pathology, and bp not high enough to indicated hypertensive emergency/urgency.  Likely malaise due to lack of sleep.  Very small amount of ativan (4 tabs 1mg ) written for anxiety/sleep.  Also extensively discussed lifestyle modification and melatonin.  I have reviewed all laboratory and imaging studies if ordered as above  1. Insomnia   2. Lightheadedness           Mirian MoMatthew Cadel Stairs, MD 01/21/15 2116

## 2015-01-21 NOTE — Discharge Instructions (Signed)

## 2015-01-21 NOTE — ED Notes (Signed)
Pt. Reports he  Is under a lot of stress at his job and needs rest and has felt dizzy since lunch today.  Pt. Reports he had some dizziness last night also.  Pt. Reports no other symptoms besides dizziness except less appetite today.  Pt. [place] [facility] and it scared him because it read "very poor in all 4 categories". ??

## 2015-01-21 NOTE — ED Notes (Signed)
MD at bedside.  Patient counseled regarding high levels of stress with work and following up with psychiatry due to lack of sleep.  Patient voiced understanding.  MD recommended room strictly for sleeping at home as well as OTC medications to take (melatonin and benadryl) for sleep. Patient reported that benadryl does not work for him.  MD encouraged patient to return to ER if any worsening symptoms occur.  Patient to follow with primary care regarding this issue.

## 2015-01-24 NOTE — ED Notes (Signed)
Patient called for further information about the dosage of the OTC. Recommended to the patient that they follow the instructions on the bottle or follow up with a pharmacist.

## 2016-09-01 ENCOUNTER — Emergency Department (HOSPITAL_COMMUNITY): Payer: Managed Care, Other (non HMO)

## 2016-09-01 ENCOUNTER — Emergency Department (HOSPITAL_COMMUNITY)
Admission: EM | Admit: 2016-09-01 | Discharge: 2016-09-01 | Disposition: A | Discharge status: Home / Self Care | Payer: Managed Care, Other (non HMO) | Attending: Emergency Medicine | Admitting: Emergency Medicine

## 2016-09-01 ENCOUNTER — Encounter (HOSPITAL_COMMUNITY): Payer: Self-pay

## 2016-09-01 DIAGNOSIS — R252 Cramp and spasm: Secondary | ICD-10-CM

## 2016-09-01 DIAGNOSIS — M79641 Pain in right hand: Secondary | ICD-10-CM | POA: Diagnosis present

## 2016-09-01 DIAGNOSIS — R0989 Other specified symptoms and signs involving the circulatory and respiratory systems: Secondary | ICD-10-CM | POA: Insufficient documentation

## 2016-09-01 DIAGNOSIS — Z79899 Other long term (current) drug therapy: Secondary | ICD-10-CM | POA: Insufficient documentation

## 2016-09-01 DIAGNOSIS — M79642 Pain in left hand: Secondary | ICD-10-CM | POA: Insufficient documentation

## 2016-09-01 LAB — CBC
HCT: 43.1 % (ref 39.0–52.0)
HEMOGLOBIN: 13.7 g/dL (ref 13.0–17.0)
MCH: 28.8 pg (ref 26.0–34.0)
MCHC: 31.8 g/dL (ref 30.0–36.0)
MCV: 90.7 fL (ref 78.0–100.0)
Platelets: 286 10*3/uL (ref 150–400)
RBC: 4.75 MIL/uL (ref 4.22–5.81)
RDW: 12.1 % (ref 11.5–15.5)
WBC: 8 10*3/uL (ref 4.0–10.5)

## 2016-09-01 LAB — BASIC METABOLIC PANEL
ANION GAP: 7 (ref 5–15)
BUN: 12 mg/dL (ref 6–20)
CALCIUM: 9.6 mg/dL (ref 8.9–10.3)
CO2: 26 mmol/L (ref 22–32)
CREATININE: 0.98 mg/dL (ref 0.61–1.24)
Chloride: 106 mmol/L (ref 101–111)
GLUCOSE: 99 mg/dL (ref 65–99)
Potassium: 4.4 mmol/L (ref 3.5–5.1)
Sodium: 139 mmol/L (ref 135–145)

## 2016-09-01 LAB — I-STAT TROPONIN, ED: TROPONIN I, POC: 0 ng/mL (ref 0.00–0.08)

## 2016-09-01 NOTE — ED Provider Notes (Signed)
[facility]-EMERGENCY DEPT Provider Note   CSN: [id number] Arrival date & time: 09/01/16  1405  By signing my name below, I, John Mcgee, attest that this documentation has been prepared under the direction and in the presence of John Guiseana Duo Ryelee Albee, MD. Electronically Signed: Rosario AdieWilliam Andrew Mcgee, ED Scribe. 09/01/16. 4:12 PM.  History   Chief Complaint Chief Complaint  Patient presents with  . Hand Pain   The history is provided by the patient. No language interpreter was used.    HPI Comments: John Mcgee is a 56 y.o. male with a h/o seizures, who presents to the Emergency Department complaining of intermittent episodes of left hand cramping beginning today. Pt reports that he was at work when his symptoms began, which he describes as his hand occasionally "tightening up". He was not lifting or doing strenuous activity at that time of his pain. No recent falls or trauma. 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   Pt also notes that he has had increased persistent chest congestion and intermittent centralized chest pressure, constant over the past several weeks. He notes associated occasional chills and cough secondary to this. Pt has been taking Prilosec at home with relief of this, but notes that it returns each time. He has not noticed his pain as being post-prandial. His congestion is more noticeable when he overexerts himself. He has otherwise had normal PO intake since the onset of his symptoms.    He denies any shortness of breath, syncope, fever, numbness, weakness, leg swelling, leg pain, claudication, loss of appetite, or any other associated symptoms.   Past Medical History:  Diagnosis Date  . Kidney stones   . Seizures (HCC)    There are no active problems to display for this patient.  History reviewed. No pertinent surgical history.  Home Medications    Prior to Admission medications   Medication Sig Start Date End  Date Taking? Authorizing Provider  acetaminophen (TYLENOL) 500 MG tablet Take 1,000 mg by mouth every 6 (six) hours as needed for mild pain.   Yes Historical Provider, MD  Probiotic Product (PROBIOTIC PO) Take 1 capsule by mouth every other day.   Yes Historical Provider, MD  psyllium (METAMUCIL) 58.6 % packet Take 1 packet by mouth daily.   Yes Historical Provider, MD  cyclobenzaprine (FLEXERIL) 10 MG tablet Take 1 tablet (10 mg total) by mouth 3 (three) times daily as needed for muscle spasms. Patient not taking: Reported on 09/01/2016 05/28/12   Geoffery Lyonsouglas Delo, MD  hydrochlorothiazide (HYDRODIURIL) 25 MG tablet Take 1 tablet (25 mg total) by mouth daily. Patient not taking: Reported on 09/01/2016 02/26/12   Pricilla LovelessScott Goldston, MD  HYDROcodone-acetaminophen (NORCO/VICODIN) 5-325 MG per tablet Take 2 tablets by mouth every 4 (four) hours as needed for pain. Patient not taking: Reported on 09/01/2016 06/07/12   Teressa LowerVrinda Pickering, NP  LORazepam (ATIVAN) 1 MG tablet Take 1 tablet (1 mg total) by mouth at bedtime. Patient not taking: Reported on 09/01/2016 01/21/15   Mirian MoMatthew Gentry, MD   Family History History reviewed. No pertinent family history.  Social History Social History  Substance Use Topics  . Smoking status: Never Smoker  . Smokeless tobacco: Never Used  . Alcohol use Yes     Comment: occ   Allergies   Patient has no known allergies.  Review of Systems Review of Systems 10/14 systems reviewed and are negative other than those stated in the HPI  Physical  Exam Updated Vital Signs BP (!) 133/104 (BP Location: Right Arm)   Pulse 94   Temp 97.8 F (36.6 C) (Oral)   Resp 16   SpO2 96%   Physical Exam Physical Exam  Nursing note and vitals reviewed. Constitutional: Well developed, well nourished, non-toxic, and in no acute distress Head: Normocephalic and atraumatic.  Mouth/Throat: Oropharynx is clear and moist.  Neck: Normal range of motion. Neck supple.  Cardiovascular: Normal  rate and regular rhythm. +2 radial pulses. Pulmonary/Chest: Effort normal and breath sounds normal.  Abdominal: Soft. There is no tenderness. There is no rebound and no guarding.  Musculoskeletal: Normal range of motion.  Neurological: Alert, no facial droop, fluent speech, moves all extremities symmetrically. Intact innervation involving radial, ulnar, and median nerves of the right hand.  Skin: Skin is warm and dry.  Psychiatric: Cooperative  ED Treatments / Results  DIAGNOSTIC STUDIES: Oxygen Saturation is 96% on RA, normal by my interpretation.   COORDINATION OF CARE: 4:12 PM-Discussed next steps with pt. Pt verbalized understanding and is agreeable with the plan.   Labs (all labs ordered are listed, but only abnormal results are displayed) Labs Reviewed  BASIC METABOLIC PANEL  CBC  I-STAT TROPOININ, ED   EKG  EKG Interpretation  Date/Time:  Wednesday September 01 2016 14:14:42 EST Ventricular Rate:  101 PR Interval:  132 QRS Duration: 86 QT Interval:  348 QTC Calculation: 451 R Axis:   78 Text Interpretation:  Sinus tachycardia Otherwise normal ECG other than tachycardia, no other changes  Confirmed by Mykala Mccready MD, Sylvain Hasten [phone]) on 09/01/2016 3:24:28 PM      Radiology Dg Chest 2 View  Result Date: 09/01/2016 CLINICAL DATA:  Chest pain and congestion EXAM: CHEST  2 VIEW COMPARISON:  May 02, 2005 FINDINGS: Lungs are clear. Heart size and pulmonary vascularity are normal. No pneumothorax. No adenopathy. No bone lesions. IMPRESSION: No edema or consolidation. Electronically Signed   By: Bretta Bang III M.D.   On: 09/01/2016 14:52   Procedures Procedures   Medications Ordered in ED Medications - No data to display  Initial Impression / Assessment and Plan / ED Course  I have reviewed the triage vital signs and the nursing notes.  Pertinent labs & imaging results that were available during my care of the patient were reviewed by me and considered in my medical  decision making (see chart for details).     Presenting with intermittent hand cramping today, and question potential overuse cramping given his occupation. No major electrolyte derangements. No symptoms currently.   Chest congestion seems separate issue, going and constant for quite several months. Seems unlikely related to ACS, and he is low risk with heart score of 1-2. Troponin negative in ED and EKG is non-ischemic. Chest x-ray visualized and shows no acute cardiopulmonary processes. History exam not concerning for dissection, PE, or other serious intrathoracic or cardiopulmonary etiologies at this time.   Discussed supportive care and watchful waiting. Discussed strict return and follow-up instructions. He expressed understanding of all discharge instructions and felt comfortable with the plan of care.  The patient appears reasonably screened and/or stabilized for discharge and I doubt any other medical condition or other [facility] requiring further screening, evaluation, or treatment in the ED at this time prior to discharge.   Final Clinical Impressions(s) / ED Diagnoses   Final diagnoses:  Cramping of hands  Chest congestion   New Prescriptions New Prescriptions   No medications on file   I personally performed the services  described in this documentation, which was scribed in my presence. The recorded information has been reviewed and is accurate.    John Guise, MD 09/01/16 [phone]

## 2016-09-01 NOTE — ED Triage Notes (Addendum)
Pt reports he had cramping in his left hand. He reports the hand tightened up and relaxed multiple times. Pt has strong grips bilaterally, able to move all digits. Pt does endorse some chest pain intermittently this week. Denies presently.

## 2016-09-01 NOTE — Discharge Instructions (Signed)
Your work-up today is reassuring.   Please follow-up closely with your primary care provider.  Please return without fail for worsening symptoms, including worsening pain, difficulty breathing, passing out, new numbness/weakness, or any other symptoms concerning to you.

## 2016-09-01 NOTE — ED Notes (Signed)
Informed Dr Verdie MosherLiu about pt discharged with high Bp. Provider said pt still okay to leave. Pt shown how to attain PCP and instructed to check BP to monitor outpatient

## 2016-10-18 ENCOUNTER — Emergency Department (HOSPITAL_COMMUNITY): Payer: Managed Care, Other (non HMO)

## 2016-10-18 ENCOUNTER — Emergency Department (HOSPITAL_COMMUNITY)
Admission: EM | Admit: 2016-10-18 | Discharge: 2016-10-18 | Disposition: A | Discharge status: Home / Self Care | Payer: Managed Care, Other (non HMO) | Attending: Emergency Medicine | Admitting: Emergency Medicine

## 2016-10-18 DIAGNOSIS — Y939 Activity, unspecified: Secondary | ICD-10-CM | POA: Insufficient documentation

## 2016-10-18 DIAGNOSIS — S6992XA Unspecified injury of left wrist, hand and finger(s), initial encounter: Secondary | ICD-10-CM | POA: Diagnosis present

## 2016-10-18 DIAGNOSIS — Y929 Unspecified place or not applicable: Secondary | ICD-10-CM | POA: Diagnosis not present

## 2016-10-18 DIAGNOSIS — Z79899 Other long term (current) drug therapy: Secondary | ICD-10-CM | POA: Insufficient documentation

## 2016-10-18 DIAGNOSIS — W228XXA Striking against or struck by other objects, initial encounter: Secondary | ICD-10-CM | POA: Diagnosis not present

## 2016-10-18 DIAGNOSIS — Y999 Unspecified external cause status: Secondary | ICD-10-CM | POA: Insufficient documentation

## 2016-10-18 DIAGNOSIS — S62327A Displaced fracture of shaft of fifth metacarpal bone, left hand, initial encounter for closed fracture: Secondary | ICD-10-CM | POA: Diagnosis not present

## 2016-10-18 MED ORDER — HYDROCODONE-ACETAMINOPHEN 5-325 MG PO TABS: 1.0000 | ORAL_TABLET | Freq: Four times a day (QID) | ORAL | 0 refills | Status: DC | PRN

## 2016-10-18 MED ORDER — NAPROXEN 250 MG PO TABS: 250.0000 mg | ORAL_TABLET | Freq: Two times a day (BID) | ORAL | 0 refills | Status: AC

## 2016-10-18 NOTE — ED Provider Notes (Signed)
[facility]-EMERGENCY DEPT Provider Note   CSN: [id number] Arrival date & time: 10/18/16  1153  By signing my name below, I, John Mcgee, attest that this documentation has been prepared under the direction and in the presence of John Clements Toro, PA-C. Electronically Signed: Sonum Mcgee, Neurosurgeon. 10/18/16. 1:31 PM.  History   Chief Complaint Chief Complaint  Patient presents with  . Hand Injury    The history is provided by the patient. No language interpreter was used.     HPI Comments: John Mcgee is a 56 y.o. male who presents to the Emergency Department complaining of a left hand injury that occurred 24 hours ago. Patient states he accidentally struck the back of his left hand on a door frame. He currently has left hand pain with associated swelling. No treatments prior to arrival. Pain is worse with movement.  He denies numbness, weakness, or any other injuries at this time.  Past Medical History:  Diagnosis Date  . Kidney stones   . Seizures (HCC)     There are no active problems to display for this patient.   No past surgical history on file.     Home Medications    Prior to Admission medications   Medication Sig Start Date End Date Taking? Authorizing Provider  acetaminophen (TYLENOL) 500 MG tablet Take 1,000 mg by mouth every 6 (six) hours as needed for mild pain.    Historical Provider, MD  cyclobenzaprine (FLEXERIL) 10 MG tablet Take 1 tablet (10 mg total) by mouth 3 (three) times daily as needed for muscle spasms. Patient not taking: Reported on 09/01/2016 05/28/12   Geoffery Lyons, MD  hydrochlorothiazide (HYDRODIURIL) 25 MG tablet Take 1 tablet (25 mg total) by mouth daily. Patient not taking: Reported on 09/01/2016 02/26/12   Pricilla Loveless, MD  HYDROcodone-acetaminophen (NORCO/VICODIN) 5-325 MG tablet Take 1-2 tablets by mouth every 6 (six) hours as needed for moderate pain or severe pain. 10/18/16   Everlene Farrier, PA-C  LORazepam (ATIVAN) 1 MG tablet Take 1 tablet (1 mg  total) by mouth at bedtime. Patient not taking: Reported on 09/01/2016 01/21/15   Mirian Mo, MD  naproxen (NAPROSYN) 250 MG tablet Take 1 tablet (250 mg total) by mouth 2 (two) times daily with a meal. 10/18/16   Everlene Farrier, PA-C  Probiotic Product (PROBIOTIC PO) Take 1 capsule by mouth every other day.    Historical Provider, MD  psyllium (METAMUCIL) 58.6 % packet Take 1 packet by mouth daily.    Historical Provider, MD    Family History No family history on file.  Social History Social History  Substance Use Topics  . Smoking status: Never Smoker  . Smokeless tobacco: Never Used  . Alcohol use Yes     Comment: occ     Allergies   Patient has no known allergies.   Review of Systems Review of Systems  Constitutional: Negative for fever.  Musculoskeletal: Positive for arthralgias and joint swelling.  Skin: Negative for rash and wound.  Neurological: Negative for weakness and numbness.     Physical Exam Updated Vital Signs BP (!) 153/104 (BP Location: Right Arm)   Pulse 83   Temp 97.6 F (36.4 C) (Oral)   Resp 17   Ht  (1.753 m)   Wt 81.6 kg   SpO2 96%   BMI 26.58 kg/m   Physical Exam  Constitutional: He appears well-developed and well-nourished. No distress.  Nontoxic appearing.  HENT:  Head: Normocephalic and atraumatic.  Eyes: Right eye exhibits  no discharge. Left eye exhibits no discharge.  Cardiovascular: Normal rate, regular rhythm and intact distal pulses.   Bilateral radial pulses are intact. Good capillary refill to his bilateral distal fingertips.  Pulmonary/Chest: Effort normal. No respiratory distress.  Musculoskeletal: Normal range of motion. He exhibits edema and tenderness. He exhibits no deformity.  Good cap refill to distal finger tips on left hand. Bilateral radial pulses intact. Good sensation to all finger tips. Tenderness and edema overlying 4th and 5th metacarpals. No ecchymosis or deformity. Able to make a fist.   Neurological: He  is alert. No sensory deficit. Coordination normal.  Skin: Skin is warm and dry. Capillary refill takes less than 2 seconds. No rash noted. He is not diaphoretic. No erythema. No pallor.  Psychiatric: He has a normal mood and affect. His behavior is normal.  Nursing note and vitals reviewed.    ED Treatments / Results  DIAGNOSTIC STUDIES: Oxygen Saturation is 96% on RA, adequate by my interpretation.    COORDINATION OF CARE: 1:28 PM Discussed treatment plan with pt at bedside and pt agreed to plan.   Labs (all labs ordered are listed, but only abnormal results are displayed) Labs Reviewed - No data to display  EKG  EKG Interpretation None       Radiology Dg Hand Complete Left  Result Date: 10/18/2016 CLINICAL DATA:  Left hand pain since striking a door last night. Initial encounter. EXAM: LEFT HAND - COMPLETE 3+ VIEW COMPARISON:  None. FINDINGS: The patient has a mildly comminuted and displaced fracture of the diaphysis of the fifth metacarpal. The fracture does not extend to either the [facility] or MCP joint. The distal fragment demonstrates mild volar angulation. No other acute bony or joint abnormality is identified. IMPRESSION: Acute fifth metacarpal fracture as described. Electronically Signed   By: Drusilla Kanner M.D.   On: 10/18/2016 12:48    Procedures Procedures (including critical care time)  SPLINT APPLICATION Date/Time: 3:15 PM Authorized by: Lawana Chambers Consent: Verbal consent obtained. Risks and benefits: risks, benefits and alternatives were discussed Consent given by: patient Splint applied by: orthopedic technician Location details: left hand Splint type: ulnar gutter  Supplies used: ace wrap, splint Post-procedure: The splinted body part was neurovascularly unchanged following the procedure. Patient tolerance: Patient tolerated the procedure well with no immediate complications.    Medications Ordered in ED Medications - No data to  display   Initial Impression / Assessment and Plan / ED Course  I have reviewed the triage vital signs and the nursing notes.  Pertinent labs & imaging results that were available during my care of the patient were reviewed by me and considered in my medical decision making (see chart for details).    This is a 56 y.o. male who presents to the Emergency Department complaining of a left hand injury that occurred 24 hours ago. Patient states he accidentally struck the back of his left hand on a door frame. On exam patient is afebrile nontoxic appearing. He is neurovascularly intact. He has edema and tenderness overlying the left fourth and fifth metacarpals. He is able to make a fist. He has good capillary refill. X-ray shows an acute fifth metacarpal fracture that is mildly angulated. I consulted with orthopedic cancer to Dr. Janee Morn. He would like [place] to splint the patient and have him call his office tomorrow for patient to follow-up in his office later this week. Patient placed in an ulnar gutter splint by the orthopedic tech. Patient tolerated the procedure well.  Reexam patient has good capillary refill and sensation to his fingertips. He reports he is feeling better after splint application. I discussed splint care precautions. Discussed narcotic pain medicine precautions. Patient was looked up on the [place] controlled substance database. He has no recent prescriptions filled for narcotic medicines. I advised to call Dr. Carollee Massed office tomorrow. I discussed return precautions. I advised the patient to follow-up with their primary care provider this week. I advised the patient to return to the emergency department with new or worsening symptoms or new concerns. The patient verbalized understanding and agreement with plan.      Final Clinical Impressions(s) / ED Diagnoses   Final diagnoses:  Closed displaced fracture of shaft of fifth metacarpal bone of left hand, initial encounter     New Prescriptions New Prescriptions   HYDROCODONE-ACETAMINOPHEN (NORCO/VICODIN) 5-325 MG TABLET    Take 1-2 tablets by mouth every 6 (six) hours as needed for moderate pain or severe pain.   NAPROXEN (NAPROSYN) 250 MG TABLET    Take 1 tablet (250 mg total) by mouth 2 (two) times daily with a meal.   I personally performed the services described in this documentation, which was scribed in my presence. The recorded information has been reviewed and is accurate.      Everlene Farrier, PA-C 10/18/16 1533    Tilden Fossa, MD 10/19/16 [phone]

## 2016-10-18 NOTE — ED Triage Notes (Signed)
Pt reports hitting his L hand on his door last night.  Swelling noted.

## 2016-10-29 ENCOUNTER — Emergency Department (HOSPITAL_COMMUNITY)
Admission: EM | Admit: 2016-10-29 | Discharge: 2016-10-29 | Disposition: A | Discharge status: Home / Self Care | Payer: Managed Care, Other (non HMO) | Attending: Emergency Medicine | Admitting: Emergency Medicine

## 2016-10-29 ENCOUNTER — Encounter (HOSPITAL_COMMUNITY): Payer: Self-pay | Admitting: Emergency Medicine

## 2016-10-29 DIAGNOSIS — S62327D Displaced fracture of shaft of fifth metacarpal bone, left hand, subsequent encounter for fracture with routine healing: Secondary | ICD-10-CM | POA: Diagnosis not present

## 2016-10-29 DIAGNOSIS — Z760 Encounter for issue of repeat prescription: Secondary | ICD-10-CM | POA: Insufficient documentation

## 2016-10-29 DIAGNOSIS — X58XXXD Exposure to other specified factors, subsequent encounter: Secondary | ICD-10-CM | POA: Diagnosis not present

## 2016-10-29 DIAGNOSIS — S6992XD Unspecified injury of left wrist, hand and finger(s), subsequent encounter: Secondary | ICD-10-CM | POA: Diagnosis present

## 2016-10-29 MED ORDER — HYDROCODONE-ACETAMINOPHEN 5-325 MG PO TABS: 1.0000 | ORAL_TABLET | Freq: Four times a day (QID) | ORAL | 0 refills | Status: AC | PRN

## 2016-10-29 NOTE — ED Notes (Signed)
Pt left at 1818, but returned to the ED at 1859 and wants to be seen. Pt informed that we will pull him back to fast track when a room is available.

## 2016-10-29 NOTE — ED Notes (Signed)
Pt called x3. It appears pt left d/t wait time. He stated in triage that he was not going to wait for long.

## 2016-10-29 NOTE — ED Notes (Signed)
Pt called for room x1 no answer  

## 2016-10-29 NOTE — Discharge Instructions (Signed)
Return here as needed.  Follow-up with the orthopedist °

## 2016-10-29 NOTE — ED Notes (Signed)
Pt called for room x2- no answer, pt not in lobby

## 2016-10-29 NOTE — ED Triage Notes (Addendum)
Pt verbalizes ran out of pain medication for hand fracture; called PCP for refill and they verbalizes must have filled here since follow up isnt until Tuesday. Last dose taken last night.

## 2016-10-29 NOTE — ED Notes (Signed)
Registration informed this RN that pt has returned.

## 2016-10-29 NOTE — ED Notes (Signed)
Ulnar gutter request from provider, called ortho per verbal order

## 2016-11-04 NOTE — ED Provider Notes (Signed)
[facility]-EMERGENCY DEPT Provider Note   CSN: [id number] Arrival date & time: 10/29/16  1649     History   Chief Complaint Chief Complaint  Patient presents with  . Medication Refill  . Hand Pain    HPI John Mcgee is a 56 y.o. male.  HPI Patient presents to the emergency department with need for medication refill.  The patient states he is out of his pain medications following fracture in his left hand.  Patient states he also took the splint off because it was too cumbersome to deal with while at work.  Patient has no other complaints Past Medical History:  Diagnosis Date  . Kidney stones   . Seizures (HCC)     There are no active problems to display for this patient.   History reviewed. No pertinent surgical history.     Home Medications    Prior to Admission medications   Medication Sig Start Date End Date Taking? Authorizing Provider  acetaminophen (TYLENOL) 500 MG tablet Take 1,000 mg by mouth every 6 (six) hours as needed for mild pain.    Historical Provider, MD  cyclobenzaprine (FLEXERIL) 10 MG tablet Take 1 tablet (10 mg total) by mouth 3 (three) times daily as needed for muscle spasms. Patient not taking: Reported on 09/01/2016 05/28/12   Geoffery Lyons, MD  hydrochlorothiazide (HYDRODIURIL) 25 MG tablet Take 1 tablet (25 mg total) by mouth daily. Patient not taking: Reported on 09/01/2016 02/26/12   Pricilla Loveless, MD  HYDROcodone-acetaminophen (NORCO/VICODIN) 5-325 MG tablet Take 1 tablet by mouth every 6 (six) hours as needed for moderate pain or severe pain. 10/29/16   Murdock Jellison, PA-C  LORazepam (ATIVAN) 1 MG tablet Take 1 tablet (1 mg total) by mouth at bedtime. Patient not taking: Reported on 09/01/2016 01/21/15   Mirian Mo, MD  naproxen (NAPROSYN) 250 MG tablet Take 1 tablet (250 mg total) by mouth 2 (two) times daily with a meal. 10/18/16   Everlene Farrier, PA-C  Probiotic Product (PROBIOTIC PO) Take 1 capsule by mouth every other day.    Historical  Provider, MD  psyllium (METAMUCIL) 58.6 % packet Take 1 packet by mouth daily.    Historical Provider, MD    Family History No family history on file.  Social History Social History  Substance Use Topics  . Smoking status: Never Smoker  . Smokeless tobacco: Never Used  . Alcohol use Yes     Comment: occ     Allergies   Patient has no known allergies.   Review of Systems Review of Systems All other systems negative except as documented in the HPI. All pertinent positives and negatives as reviewed in the HPI.  Physical Exam Updated Vital Signs BP (!) 144/102 (BP Location: Left Arm)   Pulse 78   Temp 97.9 F (36.6 C) (Oral)   Resp 20   SpO2 97%   Physical Exam  Constitutional: He is oriented to person, place, and time. He appears well-developed and well-nourished. No distress.  HENT:  Head: Normocephalic and atraumatic.  Eyes: Pupils are equal, round, and reactive to light.  Pulmonary/Chest: Effort normal.  Neurological: He is alert and oriented to person, place, and time.  Skin: Skin is warm and dry.  Psychiatric: He has a normal mood and affect.  Nursing note and vitals reviewed.    ED Treatments / Results  Labs (all labs ordered are listed, but only abnormal results are displayed) Labs Reviewed - No data to display  EKG  EKG Interpretation  None       Radiology No results found.  Procedures Procedures (including critical care time)  Medications Ordered in ED Medications - No data to display   Initial Impression / Assessment and Plan / ED Course  I have reviewed the triage vital signs and the nursing notes.  Pertinent labs & imaging results that were available during my care of the patient were reviewed by me and considered in my medical decision making (see chart for details).     Agent was placed back in a ulnar gutter splint and he does have an appointment Tuesday with the hand surgeon.  I advised him to wear the splint until that time  giving strategies to keep the splint clean told to elevate as much as possible.  Patient agrees the plan and all questions were answered  Final Clinical Impressions(s) / ED Diagnoses   Final diagnoses:  Closed displaced fracture of shaft of fifth metacarpal bone of left hand with routine healing, subsequent encounter    New Prescriptions Discharge Medication List as of 10/29/2016  8:13 PM       Charlestine Night, PA-C 11/04/16 0105    Lyndal Pulley, MD 11/05/16 [phone]

## 2016-11-09 ENCOUNTER — Encounter (HOSPITAL_BASED_OUTPATIENT_CLINIC_OR_DEPARTMENT_OTHER): Payer: Self-pay | Admitting: *Deleted

## 2016-11-09 ENCOUNTER — Other Ambulatory Visit: Payer: Self-pay | Admitting: Orthopedic Surgery

## 2016-11-10 NOTE — H&P (Signed)
DAESHON GRAMMATICO is an 56 y.o. male.   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  He reports that he accidentally struck the back of his hand on a door frame on the date above, and it was evaluated in the emergency department the following day.  He was placed into a splint and presents for further evaluation, now 3.5 weeks following injury  Past Medical History:  Diagnosis Date  . Hypertension    not taking BP meds  . Kidney stones   . Metacarpal bone fracture    left 5th MC  . Seizures (HCC)    none since 2009 no meds    Past Surgical History:  Procedure Laterality Date  . NASAL SINUS SURGERY      History reviewed. No pertinent family history. Social History:  reports that he has never smoked. He has never used smokeless tobacco. He reports that he drinks alcohol. He reports that he does not use drugs.  Allergies: No Known Allergies  No prescriptions prior to admission.    No results found for this or any previous visit (from the past 48 hour(s)). No results found.  Review of Systems  All other systems reviewed and are negative.   Height  (1.753 m), weight 81.6 kg (180 lb). Physical Exam  Constitutional:  WD, WN, NAD HEENT:  NCAT, EOMI Neuro/Psych:  Alert & oriented to person, place, and time; appropriate mood & affect Lymphatic: No generalized UE edema or lymphadenopathy Extremities / MSK:  Both UE are normal with respect to appearance, ranges of motion, joint stability, muscle strength/tone, sensation, & perfusion except as otherwise noted:  The splint is removed and the left hand, the MP joint is stiff.  With attempts at flexion, there is obvious crossover of the small of the ring finger.  NVI.  Labs / Xrays:  3 views of the left hand ordered and obtained today reveals a metacarpal shaft fracture of the fifth metacarpal, 20 angulated volar  radially.  Assessment: Displaced angulated left fifth metacarpal fracture causing crossover with the ring finger  Plan:  I discussed these findings with him and the options of continued nonoperative treatment resulting in persisting crossover versus operative treatment to restore the alignment and optimize functional recovery.  After careful consideration deliberation, he indicated he would like to proceed surgically, and is being scheduled for Thursday morning this week.  The details of the operative procedure were discussed with the patient.  Questions were invited and answered.  In addition to the goal of the procedure, the risks of the procedure to include but not limited to bleeding; infection; damage to the nerves or blood vessels that could result in bleeding, numbness, weakness, chronic pain, and the need for additional procedures; stiffness; the need for revision surgery; and anesthetic risks were reviewed.  No specific outcome was guaranteed or implied.  Informed consent was obtained.  Vernisha Bacote A., MD 11/10/2016, 4:23 PM

## 2016-11-10 NOTE — Anesthesia Preprocedure Evaluation (Addendum)
Anesthesia Evaluation  Patient identified by MRN, date of birth, ID band Patient awake    Reviewed: Allergy & Precautions, NPO status , Patient's Chart, lab work & pertinent test results  History of Anesthesia Complications Negative for: history of anesthetic complications  Airway Mallampati: II  TM Distance: >3 FB Neck ROM: Full    Dental no notable dental hx. (+) Dental Advisory Given   Pulmonary neg pulmonary ROS,    Pulmonary exam normal        Cardiovascular hypertension, negative cardio ROS Normal cardiovascular exam     Neuro/Psych Seizures -, Well Controlled,  negative psych ROS   GI/Hepatic negative GI ROS, Neg liver ROS,   Endo/Other  negative endocrine ROS  Renal/GU negative Renal ROS     Musculoskeletal negative musculoskeletal ROS (+)   Abdominal   Peds  Hematology negative hematology ROS (+)   Anesthesia Other Findings Day of surgery medications reviewed with the patient.  Reproductive/Obstetrics                            Anesthesia Physical Anesthesia Plan  ASA: II  Anesthesia Plan: General   Post-op Pain Management: GA combined w/ Regional for post-op pain   Induction: Intravenous  Airway Management Planned: LMA  Additional Equipment:   Intra-op Plan:   Post-operative Plan: Extubation in OR  Informed Consent: I have reviewed the patients History and Physical, chart, labs and discussed the procedure including the risks, benefits and alternatives for the proposed anesthesia with the patient or authorized representative who has indicated his/her understanding and acceptance.   Dental advisory given  Plan Discussed with: CRNA and Anesthesiologist  Anesthesia Plan Comments:        Anesthesia Quick Evaluation

## 2016-11-11 ENCOUNTER — Ambulatory Visit (HOSPITAL_COMMUNITY): Payer: Managed Care, Other (non HMO)

## 2016-11-11 ENCOUNTER — Encounter (HOSPITAL_BASED_OUTPATIENT_CLINIC_OR_DEPARTMENT_OTHER)
Admission: RE | Disposition: A | Discharge status: Home / Self Care | Payer: Self-pay | Source: Ambulatory Visit | Attending: Orthopedic Surgery

## 2016-11-11 ENCOUNTER — Ambulatory Visit (HOSPITAL_BASED_OUTPATIENT_CLINIC_OR_DEPARTMENT_OTHER)
Admission: RE | Admit: 2016-11-11 | Discharge: 2016-11-11 | Disposition: A | Discharge status: Home / Self Care | Payer: Managed Care, Other (non HMO) | Source: Ambulatory Visit | Attending: Orthopedic Surgery | Admitting: Orthopedic Surgery

## 2016-11-11 ENCOUNTER — Encounter (HOSPITAL_BASED_OUTPATIENT_CLINIC_OR_DEPARTMENT_OTHER): Payer: Self-pay | Admitting: Certified Registered"

## 2016-11-11 ENCOUNTER — Ambulatory Visit (HOSPITAL_BASED_OUTPATIENT_CLINIC_OR_DEPARTMENT_OTHER): Payer: Managed Care, Other (non HMO) | Admitting: Anesthesiology

## 2016-11-11 DIAGNOSIS — I10 Essential (primary) hypertension: Secondary | ICD-10-CM | POA: Insufficient documentation

## 2016-11-11 DIAGNOSIS — Z87442 Personal history of urinary calculi: Secondary | ICD-10-CM | POA: Insufficient documentation

## 2016-11-11 DIAGNOSIS — S62307A Unspecified fracture of fifth metacarpal bone, left hand, initial encounter for closed fracture: Secondary | ICD-10-CM | POA: Diagnosis present

## 2016-11-11 DIAGNOSIS — W2209XA Striking against other stationary object, initial encounter: Secondary | ICD-10-CM | POA: Diagnosis not present

## 2016-11-11 DIAGNOSIS — S62397A Other fracture of fifth metacarpal bone, left hand, initial encounter for closed fracture: Secondary | ICD-10-CM | POA: Diagnosis not present

## 2016-11-11 DIAGNOSIS — S62317A Displaced fracture of base of fifth metacarpal bone. left hand, initial encounter for closed fracture: Secondary | ICD-10-CM

## 2016-11-11 HISTORY — DX: Unspecified fracture of unspecified metacarpal bone, initial encounter for closed fracture: S62.309A

## 2016-11-11 HISTORY — PX: OPEN REDUCTION INTERNAL FIXATION (ORIF) METACARPAL: SHX6234

## 2016-11-11 HISTORY — DX: Essential (primary) hypertension: I10

## 2016-11-11 SURGERY — OPEN REDUCTION INTERNAL FIXATION (ORIF) METACARPAL
Anesthesia: General | Site: Hand | Laterality: Left

## 2016-11-11 MED ORDER — LIDOCAINE 2% (20 MG/ML) 5 ML SYRINGE
INTRAMUSCULAR | Status: AC
  Filled 2016-11-11: qty 25

## 2016-11-11 MED ORDER — PROMETHAZINE HCL 25 MG/ML IJ SOLN: 6.2500 mg | INTRAMUSCULAR | Status: DC | PRN

## 2016-11-11 MED ORDER — ONDANSETRON HCL 4 MG/2ML IJ SOLN
INTRAMUSCULAR | Status: DC | PRN
  Administered 2016-11-11: 4 mg via INTRAVENOUS

## 2016-11-11 MED ORDER — PROPOFOL 500 MG/50ML IV EMUL
INTRAVENOUS | Status: AC
  Filled 2016-11-11: qty 100

## 2016-11-11 MED ORDER — DEXAMETHASONE SODIUM PHOSPHATE 10 MG/ML IJ SOLN
INTRAMUSCULAR | Status: DC | PRN
  Administered 2016-11-11: 10 mg via INTRAVENOUS

## 2016-11-11 MED ORDER — 0.9 % SODIUM CHLORIDE (POUR BTL) OPTIME
TOPICAL | Status: DC | PRN
  Administered 2016-11-11: 150 mL

## 2016-11-11 MED ORDER — FENTANYL CITRATE (PF) 100 MCG/2ML IJ SOLN
50.0000 ug | INTRAMUSCULAR | Status: DC | PRN
  Administered 2016-11-11: 100 ug via INTRAVENOUS

## 2016-11-11 MED ORDER — LACTATED RINGERS IV SOLN
INTRAVENOUS | Status: DC
  Administered 2016-11-11 (×2): via INTRAVENOUS

## 2016-11-11 MED ORDER — CEFAZOLIN SODIUM-DEXTROSE 2-4 GM/100ML-% IV SOLN
2.0000 g | INTRAVENOUS | Status: AC
  Administered 2016-11-11: 2 g via INTRAVENOUS

## 2016-11-11 MED ORDER — EPHEDRINE 5 MG/ML INJ
INTRAVENOUS | Status: AC
  Filled 2016-11-11: qty 20

## 2016-11-11 MED ORDER — MIDAZOLAM HCL 2 MG/2ML IJ SOLN
INTRAMUSCULAR | Status: AC
  Filled 2016-11-11: qty 2

## 2016-11-11 MED ORDER — LACTATED RINGERS IV SOLN: INTRAVENOUS | Status: DC

## 2016-11-11 MED ORDER — PROPOFOL 10 MG/ML IV BOLUS
INTRAVENOUS | Status: DC | PRN
  Administered 2016-11-11: 200 mg via INTRAVENOUS

## 2016-11-11 MED ORDER — EPHEDRINE SULFATE 50 MG/ML IJ SOLN
INTRAMUSCULAR | Status: DC | PRN
  Administered 2016-11-11: 10 mg via INTRAVENOUS

## 2016-11-11 MED ORDER — HYDROMORPHONE HCL 1 MG/ML IJ SOLN: 0.2500 mg | INTRAMUSCULAR | Status: DC | PRN

## 2016-11-11 MED ORDER — FENTANYL CITRATE (PF) 100 MCG/2ML IJ SOLN
INTRAMUSCULAR | Status: AC
  Filled 2016-11-11: qty 2

## 2016-11-11 MED ORDER — ONDANSETRON HCL 4 MG/2ML IJ SOLN
INTRAMUSCULAR | Status: AC
  Filled 2016-11-11: qty 24

## 2016-11-11 MED ORDER — OXYCODONE HCL 5 MG PO TABS: 5.0000 mg | ORAL_TABLET | Freq: Four times a day (QID) | ORAL | 0 refills | Status: AC | PRN

## 2016-11-11 MED ORDER — CEFAZOLIN SODIUM-DEXTROSE 2-4 GM/100ML-% IV SOLN
INTRAVENOUS | Status: AC
  Filled 2016-11-11: qty 100

## 2016-11-11 MED ORDER — ACETAMINOPHEN 325 MG PO TABS: 650.0000 mg | ORAL_TABLET | Freq: Four times a day (QID) | ORAL | Status: AC | PRN

## 2016-11-11 MED ORDER — BUPIVACAINE HCL (PF) 0.5 % IJ SOLN
INTRAMUSCULAR | Status: AC
  Filled 2016-11-11: qty 30

## 2016-11-11 MED ORDER — DEXAMETHASONE SODIUM PHOSPHATE 10 MG/ML IJ SOLN
INTRAMUSCULAR | Status: AC
  Filled 2016-11-11: qty 6

## 2016-11-11 MED ORDER — BUPIVACAINE-EPINEPHRINE (PF) 0.5% -1:200000 IJ SOLN
INTRAMUSCULAR | Status: DC | PRN
  Administered 2016-11-11: 30 mL via PERINEURAL

## 2016-11-11 MED ORDER — SCOPOLAMINE 1 MG/3DAYS TD PT72: 1.0000 | MEDICATED_PATCH | Freq: Once | TRANSDERMAL | Status: DC | PRN

## 2016-11-11 MED ORDER — MIDAZOLAM HCL 2 MG/2ML IJ SOLN
1.0000 mg | INTRAMUSCULAR | Status: DC | PRN
  Administered 2016-11-11: 2 mg via INTRAVENOUS

## 2016-11-11 MED ORDER — PHENYLEPHRINE 40 MCG/ML (10ML) SYRINGE FOR IV PUSH (FOR BLOOD PRESSURE SUPPORT)
PREFILLED_SYRINGE | INTRAVENOUS | Status: AC
  Filled 2016-11-11: qty 20

## 2016-11-11 SURGICAL SUPPLY — 61 items
BIT DRILL 1.1 MINI QC NONSTRL (BIT) ×2 IMPLANT
BLADE MINI RND TIP GREEN BEAV (BLADE) IMPLANT
BLADE SURG 15 STRL LF DISP TIS (BLADE) ×1 IMPLANT
BLADE SURG 15 STRL SS (BLADE) ×3
BNDG CMPR 9X4 STRL LF SNTH (GAUZE/BANDAGES/DRESSINGS) ×1
BNDG COHESIVE 4X5 TAN STRL (GAUZE/BANDAGES/DRESSINGS) ×3 IMPLANT
BNDG ESMARK 4X9 LF (GAUZE/BANDAGES/DRESSINGS) ×3 IMPLANT
BNDG GAUZE ELAST 4 BULKY (GAUZE/BANDAGES/DRESSINGS) ×3 IMPLANT
CANISTER SUCTION 1200CC (MISCELLANEOUS) ×2 IMPLANT
CHLORAPREP W/TINT 26ML (MISCELLANEOUS) ×3 IMPLANT
CORDS BIPOLAR (ELECTRODE) ×3 IMPLANT
COVER BACK TABLE 60X90IN (DRAPES) ×3 IMPLANT
COVER MAYO STAND STRL (DRAPES) ×3 IMPLANT
CUFF TOURNIQUET SINGLE 18IN (TOURNIQUET CUFF) ×2 IMPLANT
DRAPE C-ARM 42X72 X-RAY (DRAPES) ×3 IMPLANT
DRAPE EXTREMITY T 121X128X90 (DRAPE) ×3 IMPLANT
DRAPE SURG 17X23 STRL (DRAPES) ×3 IMPLANT
DRSG EMULSION OIL 3X3 NADH (GAUZE/BANDAGES/DRESSINGS) ×3 IMPLANT
GAUZE SPONGE 4X4 12PLY STRL LF (GAUZE/BANDAGES/DRESSINGS) ×3 IMPLANT
GLOVE BIO SURGEON STRL SZ 6.5 (GLOVE) ×1 IMPLANT
GLOVE BIO SURGEON STRL SZ7.5 (GLOVE) ×3 IMPLANT
GLOVE BIO SURGEONS STRL SZ 6.5 (GLOVE) ×1
GLOVE BIOGEL PI IND STRL 7.0 (GLOVE) ×1 IMPLANT
GLOVE BIOGEL PI IND STRL 8 (GLOVE) ×1 IMPLANT
GLOVE BIOGEL PI INDICATOR 7.0 (GLOVE) ×6
GLOVE BIOGEL PI INDICATOR 8 (GLOVE) ×2
GLOVE ECLIPSE 6.5 STRL STRAW (GLOVE) ×3 IMPLANT
GOWN STRL REUS W/ TWL LRG LVL3 (GOWN DISPOSABLE) ×2 IMPLANT
GOWN STRL REUS W/TWL LRG LVL3 (GOWN DISPOSABLE) ×3
GOWN STRL REUS W/TWL XL LVL3 (GOWN DISPOSABLE) ×5 IMPLANT
NDL HYPO 25X1 1.5 SAFETY (NEEDLE) IMPLANT
NEEDLE HYPO 25X1 1.5 SAFETY (NEEDLE) IMPLANT
NS IRRIG 1000ML POUR BTL (IV SOLUTION) ×3 IMPLANT
PACK BASIN DAY SURGERY FS (CUSTOM PROCEDURE TRAY) ×3 IMPLANT
PADDING CAST ABS 4INX4YD NS (CAST SUPPLIES)
PADDING CAST ABS COTTON 4X4 ST (CAST SUPPLIES) IMPLANT
PLATE TYL 1.5 (Plate) ×2 IMPLANT
SCREW L 1.5X12 (Screw) ×4 IMPLANT
SCREW L 1.5X14 (Screw) ×4 IMPLANT
SCREW LOCKING 1.5X11MM (Screw) ×2 IMPLANT
SCREW LOCKING 1.5X13MM (Screw) ×8 IMPLANT
SCREW LOCKING 1.5X16 (Screw) ×8 IMPLANT
SLEEVE SCD COMPRESS KNEE MED (MISCELLANEOUS) ×3 IMPLANT
SLING ARM FOAM STRAP LRG (SOFTGOODS) ×2 IMPLANT
SPLINT PLASTER CAST XFAST 3X15 (CAST SUPPLIES) ×1 IMPLANT
SPLINT PLASTER CAST XFAST 4X15 (CAST SUPPLIES) IMPLANT
SPLINT PLASTER XTRA FAST SET 4 (CAST SUPPLIES) ×28
SPLINT PLASTER XTRA FASTSET 3X (CAST SUPPLIES)
STOCKINETTE 6  STRL (DRAPES) ×2
STOCKINETTE 6 STRL (DRAPES) ×1 IMPLANT
SUCTION FRAZIER HANDLE 10FR (MISCELLANEOUS) ×2
SUCTION TUBE FRAZIER 10FR DISP (MISCELLANEOUS) IMPLANT
SUT VICRYL RAPIDE 4-0 (SUTURE) IMPLANT
SUT VICRYL RAPIDE 4/0 PS 2 (SUTURE) ×4 IMPLANT
SYR 10ML LL (SYRINGE) IMPLANT
SYR BULB 3OZ (MISCELLANEOUS) ×2 IMPLANT
TOWEL OR 17X24 6PK STRL BLUE (TOWEL DISPOSABLE) ×3 IMPLANT
TOWEL OR NON WOVEN STRL DISP B (DISPOSABLE) ×2 IMPLANT
TUBE CONNECTING 20'X1/4 (TUBING) ×1
TUBE CONNECTING 20X1/4 (TUBING) ×1 IMPLANT
UNDERPAD 30X30 (UNDERPADS AND DIAPERS) ×3 IMPLANT

## 2016-11-11 NOTE — Op Note (Addendum)
11/11/2016  7:33 AM  PATIENT:  John Mcgee  56 y.o. male  PRE-OPERATIVE DIAGNOSIS:  Displaced left 5th metacarpal fracture  POST-OPERATIVE DIAGNOSIS:  Same  PROCEDURE:  ORIF left 5th MC fx  SURGEON: Cliffton Asters. Janee Morn, MD  PHYSICIAN ASSISTANT: Danielle Rankin, OPA-C  ANESTHESIA:  general  SPECIMENS:  None  DRAINS:   None  EBL:  less than 50 mL  PREOPERATIVE INDICATIONS:  John Mcgee is a  56 y.o. male with a 81+ week old displaced, angulated, left 5th MC fx with resulting overlapping of the SF on the RF  The risks benefits and alternatives were discussed with the patient preoperatively including but not limited to the risks of infection, bleeding, nerve injury, cardiopulmonary complications, the need for revision surgery, among others, and the patient verbalized understanding and consented to proceed.  OPERATIVE IMPLANTS: Biomet ALPS plate/screws  OPERATIVE PROCEDURE:  After receiving prophylactic antibiotics, the patient was escorted to the operative theatre and placed in a supine position.  General anesthesia was administered. A surgical "time-out" was performed during which the planned procedure, proposed operative site, and the correct patient identity were compared to the operative consent and agreement confirmed by the circulating nurse according to current facility policy.  Following application of a tourniquet to the operative extremity, the exposed skin was prepped with Chloraprep and draped in the usual sterile fashion.  The limb was exsanguinated with an Esmarch bandage and the tourniquet inflated to approximately higher than systolic BP.  A longitudinal dorsal ulnar approach was made to the fifth metacarpal, reflecting the extensor tendons radially.  Periosteum was incised and reflected radially and ulnarly.  Callus was removed from the fracture site using a curette and rongeur in order to gain mobility, and this was copiously irrigated.  The fracture  fragments were reduced and held with a clamp before being temporarily fixed with K wires.  The crossover tendency of the small finger over the ring finger was improved.  A T-Y plate from the 4.0JW Biomet ALPS hand set was selected and applied to the dorsal aspect of the fifth metacarpal.  Most of the holes were sequentially then drilled and filled with locking screws.  Final fluoroscopic images were obtained in the MCP joint was manipulated to gain full extension and tied to the racemic the stiffness that it started in over the past 3 weeks.  The periosteum intrinsic muscle fascia was closed over the plate, and the wound copiously irrigated as the tourniquet was released.  Some additional hemostasis was obtained and the skin was closed with 4-0 Vicryl Rapide combination of interrupted subcuticular and running horizontal mattress suture.  A short arm splint dressing was applied placing the MP joints into the flexed position and he was awakened and taken to the recovery room stable condition, responding spontaneously  DISPOSITION: He'll be discharged home today with typical instructions, returning in 7-10 days with follow-on hand therapy appointment for splint fabrication and initiation of rehabilitation.

## 2016-11-11 NOTE — Anesthesia Postprocedure Evaluation (Addendum)
Anesthesia Post Note  Patient: John Mcgee  Procedure(s) Performed: Procedure(s) (LRB): OPEN TREATMENT OF LEFT FIFTH METACARPAL FRACTURE (Left)  Patient location during evaluation: PACU Anesthesia Type: General Level of consciousness: sedated Pain management: pain level controlled Vital Signs Assessment: post-procedure vital signs reviewed and stable Respiratory status: spontaneous breathing and respiratory function stable Cardiovascular status: stable Anesthetic complications: no       Last Vitals:  Vitals:   11/11/16 0943 11/11/16 1014  BP:  123/89  Pulse: 85 83  Resp: 17 16  Temp:  36.4 C    Last Pain:  Vitals:   11/11/16 1014  TempSrc: Oral  PainSc:                  [name]

## 2016-11-11 NOTE — Anesthesia Procedure Notes (Signed)
Procedure Name: LMA Insertion Date/Time: 11/11/2016 7:38 AM Performed by: Damario Gillie D Pre-anesthesia Checklist: Patient identified, Emergency Drugs available, Suction available and Patient being monitored Patient Re-evaluated:Patient Re-evaluated prior to inductionOxygen Delivery Method: Circle system utilized Preoxygenation: Pre-oxygenation with 100% oxygen Intubation Type: IV induction Ventilation: Mask ventilation without difficulty LMA: LMA inserted LMA Size: 4.0 Number of attempts: 1 Airway Equipment and Method: Bite block Placement Confirmation: positive ETCO2 Tube secured with: Tape Dental Injury: Teeth and Oropharynx as per pre-operative assessment

## 2016-11-11 NOTE — Progress Notes (Signed)
Assisted Dr. Singer with left, ultrasound guided, supraclavicular block. Side rails up, monitors on throughout procedure. See vital signs in flow sheet. Tolerated Procedure well. 

## 2016-11-11 NOTE — Transfer of Care (Signed)
Immediate Anesthesia Transfer of Care Note  Patient: John Mcgee  Procedure(s) Performed: Procedure(s): OPEN TREATMENT OF LEFT FIFTH METACARPAL FRACTURE (Left)  Patient Location: PACU  Anesthesia Type:GA combined with regional for post-op pain  Level of Consciousness: awake and patient cooperative  Airway & Oxygen Therapy: Patient Spontanous Breathing and Patient connected to face mask oxygen  Post-op Assessment: Report given to RN and Post -op Vital signs reviewed and stable  Post vital signs: Reviewed and stable  Last Vitals:  Vitals:   11/11/16 0713 11/11/16 0715  BP:  126/82  Pulse: 78   Resp: 13   Temp:      Last Pain:  Vitals:   11/11/16 0638  TempSrc: Oral  PainSc: 5       Patients Stated Pain Goal: 1 (11/11/16 1610)  Complications: No apparent anesthesia complications

## 2016-11-11 NOTE — Interval H&P Note (Signed)
History and Physical Interval Note:  11/11/2016 7:33 AM  John Mcgee  has presented today for surgery, with the diagnosis of LEFT 5TH METACARPAL FRACTURE  The various methods of treatment have been discussed with the patient and family. After consideration of risks, benefits and other options for treatment, the patient has consented to  Procedure(s): OPEN TREATMENT OF LEFT 5TH METACARPAL FRACTURE (Left) as a surgical intervention .  The patient's history has been reviewed, patient examined, no change in status, stable for surgery.  I have reviewed the patient's chart and labs.  Questions were answered to the patient's satisfaction.     Tyneisha Hegeman A.

## 2016-11-11 NOTE — Anesthesia Procedure Notes (Signed)
Anesthesia Regional Block: Supraclavicular block   Pre-Anesthetic Checklist: ,, timeout performed, Correct Patient, Correct Site, Correct Laterality, Correct Procedure, Correct Position, site marked, Risks and benefits discussed,  Surgical consent,  Pre-op evaluation,  At surgeon's request and post-op pain management  Laterality: Left  Prep: chloraprep       Needles:  Injection technique: Single-shot  Needle Type: Echogenic Stimulator Needle     Needle Length: 5cm  Needle Gauge: 22     Additional Needles:   Procedures: ultrasound guided, nerve stimulator,,,,,,   Nerve Stimulator or Paresthesia:  Response: bicep contraction, 0.45 mA,   Additional Responses:   Narrative:  Start time: 11/11/2016 6:58 AM End time: 11/11/2016 7:08 AM Injection made incrementally with aspirations every 5 mL.  Performed by: Personally  Anesthesiologist: Heather Roberts  Additional Notes: Functioning IV was confirmed and monitors applied.  A 50mm 22ga echogenic arrow stimulator was used. Sterile prep and drape,hand hygiene and sterile gloves were used.Ultrasound guidance: relevant anatomy identified, needle position confirmed, local anesthetic spread visualized around nerve(s)., vascular puncture avoided.  Image printed for medical record.  Negative aspiration and negative test dose prior to incremental administration of local anesthetic. The patient tolerated the procedure well.

## 2016-11-11 NOTE — Discharge Instructions (Signed)
Discharge Instructions   You have a dressing with a plaster splint incorporated in it. Move your fingers as much as possible, making a full fist and fully opening the fist. Elevate your hand above your elbow to reduce pain & swelling of the digits.  Ice over the operative site or in the arm pit may be helpful to reduce pain & swelling.  DO NOT USE HEAT. Pain medicine has been prescribed for you.  Use the Naproxen that you already have and over the counter tylenol as stated on the bottle. Use the prescription for Oxycodone as needed for severe break through pain in addition to Tylenol and Naproxen. Leave the dressing in place until you return to our office.  You may shower, but keep the bandage clean & dry.  You may drive a car when you are off of prescription pain medications and can safely control your vehicle with both hands. Call our office to make a follow up appointment for 10-15 days from the date of surgery.   Please call [phone] during normal business hours or [phone] after hours for any problems. Including the following:  - excessive redness of the incisions - drainage for more than 4 days - fever of more than 101.5 F  *Please note that pain medications will not be refilled after hours or on weekends.  May return to work on Monday 11/15/2016 with no lifting, gripping or grasping greater than pencil and paper tasks with the left hand.   Regional Anesthesia Blocks  1. Numbness or the inability to move the "blocked" extremity may last from 3-48 hours after placement. The length of time depends on the medication injected and your individual response to the medication. If the numbness is not going away after 48 hours, call your surgeon.  2. The extremity that is blocked will need to be protected until the numbness is gone and the  Strength has returned. Because you cannot feel it, you will need to take extra care to avoid injury. Because it may be weak, you may have  difficulty moving it or using it. You may not know what position it is in without looking at it while the block is in effect.  3. For blocks in the legs and feet, returning to weight bearing and walking needs to be done carefully. You will need to wait until the numbness is entirely gone and the strength has returned. You should be able to move your leg and foot normally before you try and bear weight or walk. You will need someone to be with you when you first try to ensure you do not fall and possibly risk injury.  4. Bruising and tenderness at the needle site are common side effects and will resolve in a few days.  5. Persistent numbness or new problems with movement should be communicated to the surgeon or the [facility] [phone] [facility] [phone]).  Post Anesthesia Home Care Instructions  Activity: Get plenty of rest for the remainder of the day. A responsible individual must stay with you for 24 hours following the procedure.  For the next 24 hours, DO NOT: -Drive a car -Advertising copywriter -Drink alcoholic beverages -Take any medication unless instructed by your physician -Make any legal decisions or sign important papers.  Meals: Start with liquid foods such as gelatin or soup. Progress to regular foods as tolerated. Avoid greasy, spicy, heavy foods. If nausea and/or vomiting occur, drink only clear liquids until the nausea and/or vomiting  subsides. Call your physician if vomiting continues.  Special Instructions/Symptoms: Your throat may feel dry or sore from the anesthesia or the breathing tube placed in your throat during surgery. If this causes discomfort, gargle with warm salt water. The discomfort should disappear within 24 hours.  If you had a scopolamine patch placed behind your ear for the management of post- operative nausea and/or vomiting:  1. The medication in the patch is effective for 72 hours, after which it should be removed.   Wrap patch in a tissue and discard in the trash. Wash hands thoroughly with soap and water. 2. You may remove the patch earlier than 72 hours if you experience unpleasant side effects which may include dry mouth, dizziness or visual disturbances. 3. Avoid touching the patch. Wash your hands with soap and water after contact with the patch.

## 2016-11-12 ENCOUNTER — Encounter (HOSPITAL_BASED_OUTPATIENT_CLINIC_OR_DEPARTMENT_OTHER): Payer: Self-pay | Admitting: Orthopedic Surgery

## 2016-12-18 NOTE — Addendum Note (Signed)
Addendum  created 12/18/16 0851 by Lovina Zuver, MD   Sign clinical note    

## 2017-08-21 ENCOUNTER — Emergency Department (HOSPITAL_COMMUNITY): Payer: Managed Care, Other (non HMO)

## 2017-08-21 ENCOUNTER — Encounter (HOSPITAL_COMMUNITY): Payer: Self-pay | Admitting: Emergency Medicine

## 2017-08-21 ENCOUNTER — Emergency Department (HOSPITAL_COMMUNITY)
Admission: EM | Admit: 2017-08-21 | Discharge: 2017-08-21 | Disposition: A | Discharge status: Home / Self Care | Payer: Managed Care, Other (non HMO) | Attending: Emergency Medicine | Admitting: Emergency Medicine

## 2017-08-21 ENCOUNTER — Other Ambulatory Visit: Payer: Self-pay

## 2017-08-21 DIAGNOSIS — I10 Essential (primary) hypertension: Secondary | ICD-10-CM | POA: Diagnosis not present

## 2017-08-21 DIAGNOSIS — Z79899 Other long term (current) drug therapy: Secondary | ICD-10-CM | POA: Insufficient documentation

## 2017-08-21 DIAGNOSIS — M25512 Pain in left shoulder: Secondary | ICD-10-CM

## 2017-08-21 MED ORDER — LIDOCAINE 5 % EX PTCH: 1.0000 | MEDICATED_PATCH | CUTANEOUS | 0 refills | Status: DC

## 2017-08-21 MED ORDER — METHOCARBAMOL 500 MG PO TABS: 500.0000 mg | ORAL_TABLET | Freq: Two times a day (BID) | ORAL | 0 refills | Status: DC

## 2017-08-21 MED ORDER — IBUPROFEN 600 MG PO TABS: 600.0000 mg | ORAL_TABLET | Freq: Four times a day (QID) | ORAL | 0 refills | Status: AC | PRN

## 2017-08-21 MED ORDER — PREDNISONE 10 MG (21) PO TBPK: ORAL_TABLET | ORAL | 0 refills | Status: DC

## 2017-08-21 NOTE — Discharge Instructions (Signed)
There were no acute abnormalities on the x-ray. Antiinflammatory medications: Take 600 mg of ibuprofen every 6 hours or 440 mg (over the counter dose) to 500 mg (prescription dose) of naproxen every 12 hours for the next 3 days. After this time, these medications may be used as needed for pain. Take these medications with food to avoid upset stomach. Choose only one of these medications, do not take them together.  Tylenol: Should you continue to have additional pain while taking the ibuprofen or naproxen, you may add in tylenol as needed. Your daily total maximum amount of tylenol from all sources should be limited to 4000mg /day for persons without liver problems, or 2000mg /day for those with liver problems. Prednisone: Take the prednisone, as prescribed, until finished. Muscle relaxer: Robaxin is a muscle relaxer and may help loosen stiff muscles. Do not take the Robaxin while driving or performing other dangerous activities.  Lidocaine patches: These are available via either prescription or over-the-counter. The over-the-counter option may be more economical one and are likely just as effective. There are multiple over-the-counter brands, such as Salonpas. Exercises: Be sure to perform the attached exercises starting with three times a week and working up to performing them daily. This is an essential part of preventing long term problems.   Follow up with the orthopedic specialist for any future management of this complaint.

## 2017-08-21 NOTE — ED Provider Notes (Signed)
[facility]-EMERGENCY DEPT Provider Note   CSN: [id number] Arrival date & time: 08/21/17  1116     History   Chief Complaint Chief Complaint  Patient presents with  . Left Shoulder Pain    HPI John Mcgee is a 57 y.o. male.  HPI   John Mcgee is a 57 y.o. male, with a history of HTN, presenting to the ED with left shoulder pain over the last several weeks.  Pain is aching, moderate to severe, posterior left shoulder, nonradiating. 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  He is right-handed and therefore frequently stabilizes himself on the left arm.  Pain is worse after a day of work.  Has been intermittently taking naproxen.  No surgeries or procedures to the shoulder, no history of DVT/PE, no recent trauma or immobilization.  No history of HIV, IV drug use, or gonorrhea.  Denies numbness, weakness, fever/chills, chest pain, swelling, color change, shortness of breath, falls/trauma, or any other complaints.      Past Medical History:  Diagnosis Date  . Hypertension    not taking BP meds  . Kidney stones   . Metacarpal bone fracture    left 5th [facility]  . Seizures (HCC)    none since 2009 no meds    There are no active problems to display for this patient.   Past Surgical History:  Procedure Laterality Date  . NASAL SINUS SURGERY    . OPEN REDUCTION INTERNAL FIXATION (ORIF) METACARPAL Left 11/11/2016   Procedure: OPEN TREATMENT OF LEFT FIFTH METACARPAL FRACTURE;  Surgeon: Mack Hook, MD;  Location: [facility];  Service: Orthopedics;  Laterality: Left;       Home Medications    Prior to Admission medications   Medication Sig Start Date End Date Taking? Authorizing Provider  acetaminophen (TYLENOL) 325 MG tablet Take 2 tablets (650 mg total) by mouth every 6 (six) hours as needed for mild pain or moderate pain. 11/11/16   Mack Hook, MD    HYDROcodone-acetaminophen (NORCO/VICODIN) 5-325 MG tablet Take 1 tablet by mouth every 6 (six) hours as needed for moderate pain or severe pain. 10/29/16   Lawyer, Cristal Deer, PA-C  ibuprofen (ADVIL,MOTRIN) 600 MG tablet Take 1 tablet (600 mg total) by mouth every 6 (six) hours as needed. 08/21/17   Lidya Mccalister C, PA-C  lidocaine (LIDODERM) 5 % Place 1 patch onto the skin daily. Remove & Discard patch within 12 hours or as directed by MD 08/21/17   Nikkie Liming C, PA-C  methocarbamol (ROBAXIN) 500 MG tablet Take 1 tablet (500 mg total) by mouth 2 (two) times daily. 08/21/17   Korben Carcione C, PA-C  naproxen (NAPROSYN) 250 MG tablet Take 1 tablet (250 mg total) by mouth 2 (two) times daily with a meal. 10/18/16   Everlene Farrier, PA-C  oxyCODONE (ROXICODONE) 5 MG immediate release tablet Take 1 tablet (5 mg total) by mouth every 6 (six) hours as needed for breakthrough pain. 11/11/16   Mack Hook, MD  predniSONE (STERAPRED UNI-PAK 21 TAB) 10 MG (21) TBPK tablet Take 6 tabs (60mg ) on day 1, 5 tabs (50mg ) on day 2, 4 tabs (40mg ) on day 3, 3 tabs (30mg ) on day 4, 2 tabs (20mg ) on day 5, and 1 tab (10mg ) on day 6. 08/21/17   Dayveon Halley C, PA-C  Pseudoeph-Doxylamine-DM-APAP (NYQUIL PO) Take by mouth.    [provider]  Pseudoephedrine-APAP-DM Dorothyann Peng  PO) Take by mouth.    [provider]    Family History History reviewed. No pertinent family history.  Social History Social History   Tobacco Use  . Smoking status: Never Smoker  . Smokeless tobacco: Never Used  Substance Use Topics  . Alcohol use: Yes    Comment: social  . Drug use: No     Allergies   Patient has no known allergies.   Review of Systems Review of Systems  Constitutional: Negative for fever.  Respiratory: Negative for shortness of breath.   Cardiovascular: Negative for chest pain and leg swelling.  Musculoskeletal: Positive for arthralgias. Negative for back pain, joint swelling and neck pain.  Skin: Negative  for color change.  Neurological: Negative for weakness, numbness and headaches.     Physical Exam Updated Vital Signs BP (!) 140/97 (BP Location: Left Arm)   Pulse 86   Temp 98.1 F (36.7 C) (Oral)   Resp 18   SpO2 97%   Physical Exam  Constitutional: He appears well-developed and well-nourished. No distress.  HENT:  Head: Normocephalic and atraumatic.  Eyes: Conjunctivae are normal.  Neck: Normal range of motion. Neck supple.  Cardiovascular: Normal rate, regular rhythm, normal heart sounds and intact distal pulses.  Pulmonary/Chest: Effort normal and breath sounds normal. No respiratory distress.  Abdominal: Soft. There is no tenderness. There is no guarding.  Musculoskeletal: He exhibits tenderness. He exhibits no edema.  Some tenderness to the left posterior shoulder.  No noted erythema, edema, instability, deformity, laxity, increased warmth, or crepitus.  Allows palpation of the shoulder without issue. Patient has good passive and active range of motion.  Only limitation is with abduction of the shoulder, however, he can still abduct greater than 90 degrees, only stopping due to pain. Full range of motion in the cervical and thoracic spine without increase in patient's pain.   Lymphadenopathy:    He has no cervical adenopathy.       Left axillary: No pectoral and no lateral adenopathy present. Neurological: He is alert.  No sensory deficits noted. Abduction and adduction of the fingers intact against resistance. Grip strength equal bilaterally. Strength 5/5 through the cardinal directions of the bilateral wrists. Strength 5/5 with flexion and extension of the bilateral elbows. Patient can touch the thumb to each one of the fingertips without difficulty.   Skin: Skin is warm and dry. Capillary refill takes less than 2 seconds. He is not diaphoretic.  Psychiatric: He has a normal mood and affect. His behavior is normal.  Nursing note and vitals reviewed.    ED  Treatments / Results  Labs (all labs ordered are listed, but only abnormal results are displayed) Labs Reviewed - No data to display  EKG  EKG Interpretation None       Radiology Dg Shoulder Left  Result Date: 08/21/2017 CLINICAL DATA:  Pain for 2 weeks.  No injury. EXAM: LEFT SHOULDER - 2+ VIEW COMPARISON:  None. FINDINGS: There is no evidence of fracture or dislocation. There is no evidence of arthropathy or other focal bone abnormality. Soft tissues are unremarkable. IMPRESSION: Negative. Electronically Signed   By: Elsie Stain M.D.   On: 08/21/2017 13:44    Procedures Procedures (including critical care time)  Medications Ordered in ED Medications - No data to display   Initial Impression / Assessment and Plan / ED Course  I have reviewed the triage vital signs and the nursing notes.  Pertinent labs & imaging results that were available during my  care of the patient were reviewed by me and considered in my medical decision making (see chart for details).      Patient presents with persistent left shoulder pain for the last several weeks.  No neuro deficits.  No red flag symptoms.  Doubt septic joint or DVT based on lack of risk factors combined with encouraging physical exam findings. No acute abnormality on x-ray. Orthopedic follow-up. The patient was given instructions for home care as well as return precautions. Patient voices understanding of these instructions, accepts the plan, and is comfortable with discharge.    Final Clinical Impressions(s) / ED Diagnoses   Final diagnoses:  Acute pain of left shoulder    ED Discharge Orders        Ordered    ibuprofen (ADVIL,MOTRIN) 600 MG tablet  Every 6 hours PRN     08/21/17 1324    methocarbamol (ROBAXIN) 500 MG tablet  2 times daily     08/21/17 1324    lidocaine (LIDODERM) 5 %  Every 24 hours     08/21/17 1324    predniSONE (STERAPRED UNI-PAK 21 TAB) 10 MG (21) TBPK tablet     08/21/17 1324       Anselm PancoastJoy,  Gerardo Caiazzo C, PA-C 08/21/17 1352    Lorre NickAllen, Anthony, MD 08/23/17 1112

## 2017-08-21 NOTE — ED Triage Notes (Signed)
Patient c/o severe, intermittent left shoulder pain for several weeks. 10/10. Experiencing limited movement, tender to touch. Not associated with injury.

## 2017-11-02 ENCOUNTER — Other Ambulatory Visit: Payer: Self-pay

## 2017-11-02 ENCOUNTER — Emergency Department (HOSPITAL_COMMUNITY)
Admission: EM | Admit: 2017-11-02 | Discharge: 2017-11-02 | Disposition: A | Discharge status: Home / Self Care | Payer: Managed Care, Other (non HMO) | Attending: Emergency Medicine | Admitting: Emergency Medicine

## 2017-11-02 ENCOUNTER — Encounter (HOSPITAL_COMMUNITY): Payer: Self-pay | Admitting: Nurse Practitioner

## 2017-11-02 ENCOUNTER — Emergency Department (HOSPITAL_COMMUNITY): Payer: Managed Care, Other (non HMO)

## 2017-11-02 DIAGNOSIS — I10 Essential (primary) hypertension: Secondary | ICD-10-CM | POA: Insufficient documentation

## 2017-11-02 DIAGNOSIS — R079 Chest pain, unspecified: Secondary | ICD-10-CM | POA: Diagnosis present

## 2017-11-02 DIAGNOSIS — Z7982 Long term (current) use of aspirin: Secondary | ICD-10-CM | POA: Insufficient documentation

## 2017-11-02 DIAGNOSIS — R0789 Other chest pain: Secondary | ICD-10-CM | POA: Insufficient documentation

## 2017-11-02 DIAGNOSIS — M25512 Pain in left shoulder: Secondary | ICD-10-CM | POA: Diagnosis not present

## 2017-11-02 DIAGNOSIS — Z79899 Other long term (current) drug therapy: Secondary | ICD-10-CM | POA: Insufficient documentation

## 2017-11-02 LAB — CBC
HCT: 42.6 % (ref 39.0–52.0)
HEMOGLOBIN: 13.9 g/dL (ref 13.0–17.0)
MCH: 30 pg (ref 26.0–34.0)
MCHC: 32.6 g/dL (ref 30.0–36.0)
MCV: 91.8 fL (ref 78.0–100.0)
PLATELETS: 304 10*3/uL (ref 150–400)
RBC: 4.64 MIL/uL (ref 4.22–5.81)
RDW: 12 % (ref 11.5–15.5)
WBC: 8.4 10*3/uL (ref 4.0–10.5)

## 2017-11-02 LAB — BASIC METABOLIC PANEL
ANION GAP: 8 (ref 5–15)
BUN: 20 mg/dL (ref 6–20)
CALCIUM: 9.5 mg/dL (ref 8.9–10.3)
CO2: 26 mmol/L (ref 22–32)
Chloride: 110 mmol/L (ref 101–111)
Creatinine, Ser: 0.98 mg/dL (ref 0.61–1.24)
Glucose, Bld: 96 mg/dL (ref 65–99)
Potassium: 4.1 mmol/L (ref 3.5–5.1)
SODIUM: 144 mmol/L (ref 135–145)

## 2017-11-02 LAB — I-STAT TROPONIN, ED
TROPONIN I, POC: 0 ng/mL (ref 0.00–0.08)
Troponin i, poc: 0 ng/mL (ref 0.00–0.08)

## 2017-11-02 MED ORDER — LIDOCAINE 5 % EX PTCH: 1.0000 | MEDICATED_PATCH | CUTANEOUS | 0 refills | Status: AC

## 2017-11-02 MED ORDER — ASPIRIN 81 MG PO CHEW: 81.0000 mg | CHEWABLE_TABLET | Freq: Every day | ORAL | 0 refills | Status: AC

## 2017-11-02 MED ORDER — PREDNISONE 20 MG PO TABS: 40.0000 mg | ORAL_TABLET | Freq: Every day | ORAL | 0 refills | Status: AC

## 2017-11-02 MED ORDER — PREDNISONE 20 MG PO TABS: 40.0000 mg | ORAL_TABLET | Freq: Every day | ORAL | 0 refills | Status: DC

## 2017-11-02 MED ORDER — METHOCARBAMOL 500 MG PO TABS: 500.0000 mg | ORAL_TABLET | Freq: Two times a day (BID) | ORAL | 0 refills | Status: AC

## 2017-11-02 NOTE — ED Provider Notes (Signed)
[facility]-EMERGENCY DEPT Provider Note   CSN: [id number] Arrival date & time: 11/02/17  1552     History   Chief Complaint No chief complaint on file.   HPI John Mcgee is a 57 y.o. male.  HPI   78 YOM arrives at the emergency department complaining of central chest pain and left shoulder pain. The pain began yesterday after eating a meal with a lot of garlic in it.  Patient reports that he feels his central chest pain feels like "indigestion".  It is located in the substernal region and does not radiate. The pain is constant, but not made worse by exertion.  Pt denies  nausea, vomiting, diaphoresis, dyspnea, syncope, palpitations, fever, peripheral edema, abdominal pain, cough, or hemoptysis.  Patient denies history of DVT/PE, treatment for cancer, recent immobilization, hospitalization, or surgery.  Pt reports this is similar to previous episodes of chest pain that he is described as indigestion, however in the setting of his left shoulder pain, he wanted to evaluate for cardiac events.  Patient does have a family history of MI in his father in his 48s.  Patient has no personal ischemic cardiac history, history of stress test or cardiac catheterization.  Patient reports that no one has ever told her treated him for hypertension.  Patient is a never smoker.  Patient also reporting that his left shoulder pain began approximately 1 month ago, and he seems to link it to an injury he had in his left pinky finger that required surgery.  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  Patient reports that the left shoulder pain is aching, on the lateral aspect, and is point tender to palpation.  Patient reports he was previously evaluated in the emergency department for this, and reports that steroids and Robaxin helped  with the pain.  Patient reports he has pain with rotary motions of the shoulder.  Patient denies erythema or edema, bogginess of the left shoulder joint, feer or chills.  Patient reports that it is not necessarily radiating from the chest pain, but reports that he feels the 2 are "connected".   Patient denies distal weakness or numbness of the left upper extremity.   Past Medical History:  Diagnosis Date  . Hypertension    not taking BP meds  . Kidney stones   . Metacarpal bone fracture    left 5th [facility]  . Seizures (HCC)    none since 2009 no meds    There are no active problems to display for this patient.   Past Surgical History:  Procedure Laterality Date  . NASAL SINUS SURGERY    . OPEN REDUCTION INTERNAL FIXATION (ORIF) METACARPAL Left 11/11/2016   Procedure: OPEN TREATMENT OF LEFT FIFTH METACARPAL FRACTURE;  Surgeon: Mack Hook, MD;  Location: [facility];  Service: Orthopedics;  Laterality: Left;        Home Medications    Prior to Admission medications   Medication Sig Start Date End Date Taking? Authorizing Provider  acetaminophen (TYLENOL) 325 MG tablet Take 2 tablets (650 mg total) by mouth every 6 (six) hours as needed for mild pain or moderate pain. 11/11/16  Yes Mack Hook, MD  Multiple Vitamins-Minerals (AIRBORNE PO) Take by mouth.   Yes [provider]  naproxen sodium (ALEVE) 220 MG tablet Take 440  mg by mouth at bedtime as needed (pain).   Yes [provider]  Probiotic Product (PROBIOTIC PO) Take by mouth.   Yes [provider]  Psyllium (METAMUCIL FIBER PO) Take 1 Scoop by mouth daily as needed (constipation).   Yes [provider]  aspirin 81 MG chewable tablet Chew 1 tablet (81 mg total) by mouth daily. 11/02/17 12/02/17  Aviva KluverMurray, Camri Molloy B, PA-C  HYDROcodone-acetaminophen (NORCO/VICODIN) 5-325 MG tablet Take 1 tablet by mouth every 6 (six) hours as needed for moderate pain or severe pain. Patient not  taking: Reported on 11/02/2017 10/29/16   Charlestine NightLawyer, Christopher, PA-C  ibuprofen (ADVIL,MOTRIN) 600 MG tablet Take 1 tablet (600 mg total) by mouth every 6 (six) hours as needed. Patient not taking: Reported on 11/02/2017 08/21/17   Joy, Ines BloomerShawn C, PA-C  lidocaine (LIDODERM) 5 % Place 1 patch onto the skin daily. Remove & Discard patch within 12 hours or as directed by MD 11/02/17   Aviva KluverMurray, Elexa Kivi B, PA-C  methocarbamol (ROBAXIN) 500 MG tablet Take 1 tablet (500 mg total) by mouth 2 (two) times daily. 11/02/17   Aviva KluverMurray, Luverta Korte B, PA-C  naproxen (NAPROSYN) 250 MG tablet Take 1 tablet (250 mg total) by mouth 2 (two) times daily with a meal. Patient not taking: Reported on 11/02/2017 10/18/16   Everlene Farrieransie, William, PA-C  oxyCODONE (ROXICODONE) 5 MG immediate release tablet Take 1 tablet (5 mg total) by mouth every 6 (six) hours as needed for breakthrough pain. Patient not taking: Reported on 11/02/2017 11/11/16   Mack Hookhompson, David, MD  predniSONE (DELTASONE) 20 MG tablet Take 2 tablets (40 mg total) by mouth daily for 4 days. 11/02/17 11/06/17  Elisha PonderMurray, Demontre Padin B, PA-C    Family History History reviewed. No pertinent family history.  Social History Social History   Tobacco Use  . Smoking status: Never Smoker  . Smokeless tobacco: Never Used  Substance Use Topics  . Alcohol use: Yes    Comment: social  . Drug use: No     Allergies   Patient has no known allergies.   Review of Systems Review of Systems  Constitutional: Negative for chills and fever.  HENT: Negative for congestion, rhinorrhea and sore throat.   Eyes: Negative for visual disturbance.  Respiratory: Negative for cough, chest tightness and shortness of breath.   Cardiovascular: Positive for chest pain. Negative for leg swelling.  Gastrointestinal: Negative for abdominal pain, nausea and vomiting.  Genitourinary: Negative for dysuria and flank pain.  Musculoskeletal: Positive for arthralgias and myalgias. Negative for back pain and joint  swelling.  Skin: Negative for rash.  Neurological: Negative for dizziness, syncope, light-headedness and headaches.     Physical Exam Updated Vital Signs BP (!) 137/102   Pulse 87   Temp 98 F (36.7 C) (Oral)   Resp (!) 25   Ht 5\' 9"  (1.753 m)   Wt 79.4 kg (175 lb)   SpO2 94%   BMI 25.84 kg/m   Physical Exam  Constitutional: He appears well-developed and well-nourished. No distress.  HENT:  Head: Normocephalic and atraumatic.  Mouth/Throat: Oropharynx is clear and moist.  Eyes: Pupils are equal, round, and reactive to light. Conjunctivae and EOM are normal.  Neck: Normal range of motion. Neck supple.  Cardiovascular: Normal rate, regular rhythm, S1 normal, S2 normal and intact distal pulses.  No murmur heard. Pulmonary/Chest: Effort normal and breath sounds normal. He has no wheezes. He has no rales.  Abdominal: Soft. He exhibits no distension. There is no tenderness. There is  no guarding.  Musculoskeletal: Normal range of motion. He exhibits no edema or deformity.       Arms: Left shoulder with tenderness to palpation of lateral deltoid as depicted in image. Full ROM, but with pain of abduction and external rotation. Negattive empty can test, + Neer's. No swelling, erythema or ecchymosis present. No step-off, crepitus, or deformity appreciated. 5/5 muscle strength of UE. 2+ radial pulse, sensation intact and all compartments soft.  Neurological: He is alert.  Cranial nerves grossly intact. Patient moves extremities symmetrically and with good coordination.  Skin: Skin is warm and dry. No rash noted. No erythema.  Psychiatric: He has a normal mood and affect. His behavior is normal. Judgment and thought content normal.  Nursing note and vitals reviewed.    ED Treatments / Results  Labs (all labs ordered are listed, but only abnormal results are displayed) Labs Reviewed  BASIC METABOLIC PANEL  CBC  I-STAT TROPONIN, ED  I-STAT TROPONIN, ED    EKG EKG  Interpretation  Date/Time:  Wednesday November 02 2017 16:03:56 EDT Ventricular Rate:  88 PR Interval:    QRS Duration: 86 QT Interval:  359 QTC Calculation: 435 R Axis:   77 Text Interpretation:  Sinus rhythm Since last tracing rate slower Confirmed by Mancel Bale [phone]) on 11/02/2017 4:09:22 PM Also confirmed by Mancel Bale [phone]), editor Josephine Igo ([id number])  on 11/02/2017 4:38:18 PM   EKG Interpretation  Date/Time:  Wednesday November 02 2017 19:50:09 EDT Ventricular Rate:  74 PR Interval:    QRS Duration: 88 QT Interval:  380 QTC Calculation: 422 R Axis:   74 Text Interpretation:  Sinus rhythm Since last tracing of earlier today No significant change was found Confirmed by Mancel Bale [phone]) on 11/02/2017 7:55:59 PM        Radiology Dg Chest 2 View  Result Date: 11/02/2017 CLINICAL DATA:  Chest pain EXAM: CHEST - 2 VIEW COMPARISON:  09/01/2016 FINDINGS: The heart size and mediastinal contours are within normal limits. Both lungs are clear. The visualized skeletal structures are unremarkable. IMPRESSION: No active cardiopulmonary disease. Electronically Signed   By: Marlan Palau M.D.   On: 11/02/2017 18:35    Procedures Procedures (including critical care time)  Medications Ordered in ED Medications - No data to display   Initial Impression / Assessment and Plan / ED Course  I have reviewed the triage vital signs and the nursing notes.  Pertinent labs & imaging results that were available during my care of the patient were reviewed by me and considered in my medical decision making (see chart for details).     Differential diagnosis includes ACS, PE, thoracic aortic dissection, Boerhaave's syndrome, cardiac tamponade, pneumothorax, incarcerated diaphragmatic hernia, cholecystitis, esophageal spasm, gastroesophageal reflux, herpes zoster of the thorax, pericarditis, pneumonia, chest wall pain, costochondritis.  Differential diagnosis for left shoulder pain  includes radiation from cardiac chest pain, rotator cuff tendinopathy, biceps tendinopathy.  Doubt ACS, as delta troponin is negative, initial and repeat EKGs show no signs of ischemia, infarction, or arrhythmia, and HEART score 3 (moderately suspicious history, age, and 1-2 risk factors). Doubt PE as Well's score 0 and PERC 1 (age), and patient not tachycardic, tachypneic, or with chest pain on inspiration. Doubt TAD by hx, CXR showed no widening mediastinum, and pulses equal in all extremities. Patient remained nontoxic appearing and in no acute distress during emergency department course. Vital signs stable with a couple hypertensive readings in the emergency department. Therefore, doubt esophageal rupture, cardiac tamponade, or pneumothorax.  Pericarditis less likely due to no preceding infectious symptoms and pain not improved in upright positions. No abnormal labs.  Patient is aware of diagnostic uncertainty and was given strict return precautions.  I discussed with the patient starting 81 mg of aspirin daily as prophylaxis antiplatelets, as he does have some cardiac risk factors, as well as placed amatory for cardiology and instruct patient is to follow-up with cardiology for further cardiac evaluation.  After negative cardiac workup, patient requesting steroids treatments, as well as muscle relaxants for left shoulder pain as prescribed prior.  I discussed that muscle relaxants or activity, driving, drinking, operating machinery.  Patient to follow-up with orthopedics.  No evidence of infectious etiology of shoulder pain.  This is a supervised visit with Dr. Mancel Bale. Evaluation, management, and discharge planning discussed with this attending physician.   Final Clinical Impressions(s) / ED Diagnoses   Final diagnoses:  Central chest pain  Left shoulder pain, unspecified chronicity    ED Discharge Orders        Ordered    Ambulatory referral to Cardiology     11/02/17 1950    aspirin  81 MG chewable tablet  Daily     11/02/17 1950    lidocaine (LIDODERM) 5 %  Every 24 hours     11/02/17 1950    methocarbamol (ROBAXIN) 500 MG tablet  2 times daily     11/02/17 1950    predniSONE (DELTASONE) 20 MG tablet  Daily,   Status:  Discontinued     11/02/17 1951    predniSONE (DELTASONE) 20 MG tablet  Daily     11/02/17 2013       Delia Chimes 11/03/17 0228    Mancel Bale, MD 11/03/17 2249

## 2017-11-02 NOTE — Discharge Instructions (Addendum)
Please read and follow all provided instructions.  Your diagnoses today include:  1. Central chest pain   2. Left shoulder pain, unspecified chronicity     Tests performed today include: An EKG of your heart A chest x-ray Cardiac enzymes - a blood test for heart muscle damage Blood counts and electrolytes Vital signs. See below for your results today.   Medications prescribed:   Take any prescribed medications only as directed.  I am starting you on 81 mg of aspirin daily.  This is to prevent plaque formation in your arteries of your heart.  You are prescribed Robaxin, a muscle relaxant. Some common side effects of this medication include:  Feeling sleepy.  Dizziness. Take care upon going from a seated to a standing position.  Dry mouth.  Feeling tired or weak.  Hard stools (constipation).  Upset stomach. These are not all of the side effects that may occur. If you have questions about side effects, call your doctor. Call your primary care provider for medical advice about side effects.  This medication can be sedating. Only take this medication as needed. Please do not combine with alcohol. Do not drive or operate machinery while taking this medication.   This medication can interact with some other medications. Make sure to tell any provider you are taking this medication before they prescribe you a new medication.    Follow-up instructions: Please follow-up with your primary care provider as soon as you can for further evaluation of your symptoms.   Return instructions:  SEEK IMMEDIATE MEDICAL ATTENTION IF: You have severe chest pain, especially if the pain is crushing or pressure-like and spreads to the arms, back, neck, or jaw, or if you have sweating, nausea (feeling sick to your stomach), or shortness of breath. THIS IS AN EMERGENCY. Don't wait to see if the pain will go away. Get medical help at once. Call 911 or 0 (operator). DO NOT drive yourself to the hospital.    Your chest pain gets worse and does not go away with rest.  You have an attack of chest pain lasting longer than usual, despite rest and treatment with the medications your caregiver has prescribed.  You wake from sleep with chest pain or shortness of breath. You feel dizzy or faint. You have chest pain not typical of your usual pain for which you originally saw your caregiver.  You have any other emergent concerns regarding your health.  Additional Information: Chest pain comes from many different causes. Your caregiver has diagnosed you as having chest pain that is not specific for one problem, but does not require admission.  You are at low risk for an acute heart condition or other serious illness.   I referred you to the cardiology group above.  I placed a referral to them.  Please also call them this week.  It is very important that she have a cardiac workup including stress test.  To find a primary care or specialty doctor please call [phone] or [phone] to access "[facility] Find a Doctor Service."  You may also go on the [facility] website at [email or web address]  There are also multiple [facility] [place] and [facility] practices throughout the [place] that are frequently accepting new patients. You may find a clinic that is close to your home and contact them.  [facility] - [address] [phone]  [facility] in [place] (also locations in [place] and [place]) - [address] [place] [phone]  [facility] - [address] [phone]    Your vital signs today were: BP 129/86    Pulse 72    Temp 98 F (36.7 C) (Oral)    Resp (!) 23    Ht 5\' 9"  (1.753 m)    Wt 79.4 kg (175 lb)    SpO2 95%    BMI 25.84 kg/m  If your blood pressure (BP) was elevated above 135/85 this visit, please have this repeated by your doctor  within one month. --------------

## 2017-11-02 NOTE — ED Triage Notes (Signed)
Patient came in for left shoulder and chest pain. Patient states he has been here for this in the past. Patient states he hurt his finger a while back and feels like that is what his shoulder pain is coming from. His doctor says it isnt from his injury.  Denies SOB. He states he feels like his chest pain feels more like indigestion pain. Patient states it is a burning internal pain.

## 2017-11-04 ENCOUNTER — Encounter (INDEPENDENT_AMBULATORY_CARE_PROVIDER_SITE_OTHER): Payer: Self-pay

## 2017-12-01 ENCOUNTER — Encounter (HOSPITAL_COMMUNITY): Payer: Self-pay | Admitting: Emergency Medicine

## 2017-12-01 ENCOUNTER — Emergency Department (HOSPITAL_COMMUNITY)
Admission: EM | Admit: 2017-12-01 | Discharge: 2017-12-01 | Disposition: A | Discharge status: Home / Self Care | Payer: Managed Care, Other (non HMO) | Attending: Emergency Medicine | Admitting: Emergency Medicine

## 2017-12-01 ENCOUNTER — Other Ambulatory Visit: Payer: Self-pay

## 2017-12-01 DIAGNOSIS — I10 Essential (primary) hypertension: Secondary | ICD-10-CM | POA: Diagnosis not present

## 2017-12-01 DIAGNOSIS — B029 Zoster without complications: Secondary | ICD-10-CM | POA: Diagnosis not present

## 2017-12-01 DIAGNOSIS — R21 Rash and other nonspecific skin eruption: Secondary | ICD-10-CM | POA: Diagnosis present

## 2017-12-01 DIAGNOSIS — Z7982 Long term (current) use of aspirin: Secondary | ICD-10-CM | POA: Diagnosis not present

## 2017-12-01 DIAGNOSIS — Z79899 Other long term (current) drug therapy: Secondary | ICD-10-CM | POA: Diagnosis not present

## 2017-12-01 MED ORDER — PREDNISONE 10 MG (21) PO TBPK: ORAL_TABLET | Freq: Every day | ORAL | 0 refills | Status: AC

## 2017-12-01 MED ORDER — OXYCODONE-ACETAMINOPHEN 5-325 MG PO TABS: 1.0000 | ORAL_TABLET | Freq: Four times a day (QID) | ORAL | 0 refills | Status: AC | PRN

## 2017-12-01 MED ORDER — VALACYCLOVIR HCL 1 G PO TABS: 1000.0000 mg | ORAL_TABLET | Freq: Three times a day (TID) | ORAL | 0 refills | Status: AC

## 2017-12-01 NOTE — ED Provider Notes (Signed)
[facility] EMERGENCY DEPARTMENT Provider Note   CSN: [id number] Arrival date & time: 12/01/17  4098     History   Chief Complaint Chief Complaint  Patient presents with  . Rash    HPI John Mcgee is a 57 y.o. male with history of kidney stones, seizures, hypertension who presents with a 2-day history of rash to his left-sided chest.  For the past 2 months he has had some pain in his left shoulder.  His rash is painful.  He describes it as burning and sharp.  It is painful to even light touch.  He took a pain medication which he received at his last visit with some relief.  He denies any fevers, chest pain, shortness of breath, numbness or tingling.  HPI  Past Medical History:  Diagnosis Date  . Hypertension    not taking BP meds  . Kidney stones   . Metacarpal bone fracture    left 5th MC  . Seizures (HCC)    none since 2009 no meds    There are no active problems to display for this patient.   Past Surgical History:  Procedure Laterality Date  . NASAL SINUS SURGERY    . OPEN REDUCTION INTERNAL FIXATION (ORIF) METACARPAL Left 11/11/2016   Procedure: OPEN TREATMENT OF LEFT FIFTH METACARPAL FRACTURE;  Surgeon: Mack Hook, MD;  Location: [facility];  Service: Orthopedics;  Laterality: Left;        Home Medications    Prior to Admission medications   Medication Sig Start Date End Date Taking? Authorizing Provider  acetaminophen (TYLENOL) 325 MG tablet Take 2 tablets (650 mg total) by mouth every 6 (six) hours as needed for mild pain or moderate pain. 11/11/16   Mack Hook, MD  aspirin 81 MG chewable tablet Chew 1 tablet (81 mg total) by mouth daily. 11/02/17 12/02/17  Aviva Kluver B, PA-C  HYDROcodone-acetaminophen (NORCO/VICODIN) 5-325 MG tablet Take 1 tablet by mouth every 6 (six) hours as needed for moderate pain or severe pain. Patient not taking: Reported on 11/02/2017 10/29/16   Charlestine Night, PA-C  ibuprofen  (ADVIL,MOTRIN) 600 MG tablet Take 1 tablet (600 mg total) by mouth every 6 (six) hours as needed. Patient not taking: Reported on 11/02/2017 08/21/17   Joy, Ines Bloomer C, PA-C  lidocaine (LIDODERM) 5 % Place 1 patch onto the skin daily. Remove & Discard patch within 12 hours or as directed by MD 11/02/17   Aviva Kluver B, PA-C  methocarbamol (ROBAXIN) 500 MG tablet Take 1 tablet (500 mg total) by mouth 2 (two) times daily. 11/02/17   Aviva Kluver B, PA-C  Multiple Vitamins-Minerals (AIRBORNE PO) Take by mouth.    [provider]  naproxen (NAPROSYN) 250 MG tablet Take 1 tablet (250 mg total) by mouth 2 (two) times daily with a meal. Patient not taking: Reported on 11/02/2017 10/18/16   Everlene Farrier, PA-C  naproxen sodium (ALEVE) 220 MG tablet Take 440 mg by mouth at bedtime as needed (pain).    [provider]  oxyCODONE (ROXICODONE) 5 MG immediate release tablet Take 1 tablet (5 mg total) by mouth every 6 (six) hours as needed for breakthrough pain. Patient not taking: Reported on 11/02/2017 11/11/16   Mack Hook, MD  oxyCODONE-acetaminophen (PERCOCET/ROXICET) 5-325 MG tablet Take 1 tablet by mouth every 6 (six) hours as needed for severe pain. 12/01/17   Dede Dobesh, Waylan Boga, PA-C  predniSONE (STERAPRED UNI-PAK 21 TAB) 10 MG (21) TBPK tablet Take by mouth  daily. Take 6 tabs by mouth daily  for 2 days, then 5 tabs for 2 days, then 4 tabs for 2 days, then 3 tabs for 2 days, 2 tabs for 2 days, then 1 tab by mouth daily for 2 days 12/01/17   Emi Holes, PA-C  Probiotic Product (PROBIOTIC PO) Take by mouth.    [provider]  Psyllium (METAMUCIL FIBER PO) Take 1 Scoop by mouth daily as needed (constipation).    [provider]  valACYclovir (VALTREX) 1000 MG tablet Take 1 tablet (1,000 mg total) by mouth 3 (three) times daily. 12/01/17   Emi Holes, PA-C    Family History History reviewed. No pertinent family history.  Social History Social History   Tobacco  Use  . Smoking status: Never Smoker  . Smokeless tobacco: Never Used  Substance Use Topics  . Alcohol use: Yes    Comment: social  . Drug use: No     Allergies   Patient has no known allergies.   Review of Systems Review of Systems  Constitutional: Negative for chills and fever.  HENT: Negative for facial swelling and sore throat.   Respiratory: Negative for shortness of breath.   Cardiovascular: Negative for chest pain.  Gastrointestinal: Negative for abdominal pain, nausea and vomiting.  Genitourinary: Negative for dysuria.  Musculoskeletal: Negative for back pain.  Skin: Positive for rash. Negative for wound.  Neurological: Negative for headaches.  Psychiatric/Behavioral: The patient is not nervous/anxious.      Physical Exam Updated Vital Signs BP (!) 130/93 (BP Location: Right Arm)   Pulse 78   Temp 98.2 F (36.8 C) (Oral)   Resp 20   SpO2 99%   Physical Exam  Constitutional: He appears well-developed and well-nourished. No distress.  HENT:  Head: Normocephalic and atraumatic.  Mouth/Throat: Oropharynx is clear and moist. No oropharyngeal exudate.  Eyes: Pupils are equal, round, and reactive to light. Conjunctivae are normal. Right eye exhibits no discharge. Left eye exhibits no discharge. No scleral icterus.  Neck: Normal range of motion. Neck supple. No thyromegaly present.  Cardiovascular: Normal rate, regular rhythm, normal heart sounds and intact distal pulses. Exam reveals no gallop and no friction rub.  No murmur heard. Pulmonary/Chest: Effort normal and breath sounds normal. No stridor. No respiratory distress. He has no wheezes. He has no rales.    Abdominal: Soft. Bowel sounds are normal. He exhibits no distension. There is no tenderness. There is no rebound and no guarding.  Musculoskeletal: He exhibits no edema.  Lymphadenopathy:    He has no cervical adenopathy.  Neurological: He is alert. Coordination normal.  Skin: Skin is warm and dry. No  rash noted. He is not diaphoretic. No pallor.  Psychiatric: He has a normal mood and affect.  Nursing note and vitals reviewed.    ED Treatments / Results  Labs (all labs ordered are listed, but only abnormal results are displayed) Labs Reviewed - No data to display  EKG None  Radiology No results found.  Procedures Procedures (including critical care time)  Medications Ordered in ED Medications - No data to display   Initial Impression / Assessment and Plan / ED Course  I have reviewed the triage vital signs and the nursing notes.  Pertinent labs & imaging results that were available during my care of the patient were reviewed by me and considered in my medical decision making (see chart for details).     Patient with suspected herpes zoster.  Patient is very well-appearing  in NAD.  Patient had a full cardiac work-up 1 month ago with no conclusion.  Patient no rash that is tender to light touch.  Appears to be herpes zoster.  No signs of secondary infection.  Will treat with Valtrex, prednisone, pain medication.  Patient advised to follow-up and establish care with a PCP.  Return precautions discussed.  Patient understands and agrees with plan.  Patient vitals stable throughout ED course and discharged in satisfactory condition.  Final Clinical Impressions(s) / ED Diagnoses   Final diagnoses:  Herpes zoster without complication    ED Discharge Orders        Ordered    valACYclovir (VALTREX) 1000 MG tablet  3 times daily     12/01/17 1002    predniSONE (STERAPRED UNI-PAK 21 TAB) 10 MG (21) TBPK tablet  Daily     12/01/17 1002    oxyCODONE-acetaminophen (PERCOCET/ROXICET) 5-325 MG tablet  Every 6 hours PRN     12/01/17 [address], PA-C 12/01/17 1005    Azalia Bilis, MD 12/02/17 1726

## 2017-12-01 NOTE — ED Triage Notes (Signed)
Pt to ER for evaluation of painful rash to central chest. NAD.

## 2017-12-01 NOTE — Discharge Instructions (Signed)
Medications: Vatrex, Prednisone, Percocet  Treatment: Take Valtrex for 1 week.  Take prednisone until completed.  Take 1 Percocet every 6 hours as needed for your pain.  You can alternate with Aleve or ibuprofen as prescribed over-the-counter.  Follow-up: Please follow-up and establish care with a primary care provider.  Please return to the emergency department if you develop any new or worsening symptoms

## 2018-06-12 ENCOUNTER — Emergency Department (HOSPITAL_COMMUNITY): Payer: Managed Care, Other (non HMO)

## 2018-06-12 ENCOUNTER — Emergency Department (HOSPITAL_COMMUNITY)
Admission: EM | Admit: 2018-06-12 | Discharge: 2018-06-12 | Disposition: A | Discharge status: Home / Self Care | Payer: Managed Care, Other (non HMO) | Attending: Emergency Medicine | Admitting: Emergency Medicine

## 2018-06-12 ENCOUNTER — Other Ambulatory Visit: Payer: Self-pay

## 2018-06-12 ENCOUNTER — Encounter (HOSPITAL_COMMUNITY): Payer: Self-pay

## 2018-06-12 DIAGNOSIS — R509 Fever, unspecified: Secondary | ICD-10-CM | POA: Insufficient documentation

## 2018-06-12 DIAGNOSIS — J069 Acute upper respiratory infection, unspecified: Secondary | ICD-10-CM | POA: Insufficient documentation

## 2018-06-12 DIAGNOSIS — R05 Cough: Secondary | ICD-10-CM | POA: Diagnosis not present

## 2018-06-12 DIAGNOSIS — I10 Essential (primary) hypertension: Secondary | ICD-10-CM | POA: Insufficient documentation

## 2018-06-12 DIAGNOSIS — M7918 Myalgia, other site: Secondary | ICD-10-CM | POA: Diagnosis present

## 2018-06-12 LAB — BASIC METABOLIC PANEL
Anion gap: 8 (ref 5–15)
BUN: 13 mg/dL (ref 6–20)
CO2: 24 mmol/L (ref 22–32)
Calcium: 8.4 mg/dL — ABNORMAL LOW (ref 8.9–10.3)
Chloride: 107 mmol/L (ref 98–111)
Creatinine, Ser: 0.88 mg/dL (ref 0.61–1.24)
GFR calc Af Amer: >60 mL/min (ref >60)
GFR calc non Af Amer: >60 mL/min (ref >60)
GLUCOSE: 102 mg/dL — AB (ref 70–99)
POTASSIUM: 3.8 mmol/L (ref 3.5–5.1)
SODIUM: 139 mmol/L (ref 135–145)

## 2018-06-12 LAB — CBC WITH DIFFERENTIAL/PLATELET
ABS IMMATURE GRANULOCYTES: 0.01 10*3/uL (ref 0.00–0.07)
Basophils Absolute: 0 10*3/uL (ref 0.0–0.1)
Basophils Relative: 0 %
Eosinophils Absolute: 0.4 10*3/uL (ref 0.0–0.5)
Eosinophils Relative: 7 %
HEMATOCRIT: 40 % (ref 39.0–52.0)
Hemoglobin: 12.6 g/dL — ABNORMAL LOW (ref 13.0–17.0)
Immature Granulocytes: 0 %
LYMPHS ABS: 1.1 10*3/uL (ref 0.7–4.0)
LYMPHS PCT: 18 %
MCH: 29.8 pg (ref 26.0–34.0)
MCHC: 31.5 g/dL (ref 30.0–36.0)
MCV: 94.6 fL (ref 80.0–100.0)
MONO ABS: 0.7 10*3/uL (ref 0.1–1.0)
MONOS PCT: 11 %
NEUTROS ABS: 3.8 10*3/uL (ref 1.7–7.7)
Neutrophils Relative %: 64 %
Platelets: 215 10*3/uL (ref 150–400)
RBC: 4.23 MIL/uL (ref 4.22–5.81)
RDW: 11.7 % (ref 11.5–15.5)
WBC: 6 10*3/uL (ref 4.0–10.5)
nRBC: 0 % (ref 0.0–0.2)

## 2018-06-12 LAB — INFLUENZA PANEL BY PCR (TYPE A & B)
INFLAPCR: NEGATIVE
Influenza B By PCR: NEGATIVE

## 2018-06-12 MED ORDER — FLUTICASONE FUROATE 50 MCG/ACT IN AEPB: 1.0000 | INHALATION_SPRAY | Freq: Two times a day (BID) | RESPIRATORY_TRACT | 0 refills | Status: AC

## 2018-06-12 MED ORDER — GUAIFENESIN-CODEINE 100-10 MG/5ML PO SOLN: 10.0000 mL | Freq: Four times a day (QID) | ORAL | 0 refills | Status: AC | PRN

## 2018-06-12 MED ORDER — BENZONATATE 200 MG PO CAPS: 200.0000 mg | ORAL_CAPSULE | Freq: Three times a day (TID) | ORAL | 0 refills | Status: AC | PRN

## 2018-06-12 NOTE — ED Notes (Signed)
Patient transported to X-ray 

## 2018-06-12 NOTE — ED Notes (Signed)
Bed: [facility] Expected date:  Expected time:  Means of arrival:  Comments: 

## 2018-06-12 NOTE — ED Triage Notes (Signed)
Patient c/o fever, chills, productive cough with yellow sputum, and body aches x 5 days.

## 2018-06-12 NOTE — Discharge Instructions (Addendum)
You were evaluated today for cough, body aches and pains.  Your chest x-ray was negative for pneumonia.  Your lab work was normal in the department today.  You most likely have an upper respiratory infection.  I have prescribed you medications to help with your symptoms. Your influenza screen is negative. Please follow-up with your PCP for reevaluation if your symptoms continue.  Return to the ED for any new or worsening symptoms such as:  Get help if: Your symptoms last for 10 days or longer. Your symptoms get worse over time. You have a fever. You have very bad pain in your face or forehead. Parts of your jaw or neck become very swollen. Get help right away if: You feel pain or pressure in your chest. You have shortness of breath. You faint or feel like you will faint. You keep throwing up (vomiting). You feel confused.

## 2018-06-12 NOTE — ED Provider Notes (Signed)
[facility]-EMERGENCY DEPT Provider Note   CSN: [id number] Arrival date & time: 06/12/18  2841     History   Chief Complaint Chief Complaint  Patient presents with  . Fever  . Chills  . Generalized Body Aches  . Cough    HPI John Mcgee is a 57 y.o. male with past medical history significant for hypertension, nonmedication, seizures who presents for evaluation of generalized body aches, fever and cough.  Patient states this started approximately 5 days ago.  Patient admits to multiple sick contacts at work.  Denies influenza vaccine.  Patient admits to subjective fever and chills as well as productive cough.  Cough is productive of white and yellow sputum.  Admits to nasal congestion as well as sore throat and rhinorrhea.  Patient has tried OTC Mucinex as well as Tylenol Cold and flu.  Denies headache, vision changes, facial pressure, hemoptysis, chest pain, shortness of breath, abdominal pain, dysuria or diarrhea.  Denies aggravating or alleviating factors other than what is stated in the HPI previously.  Patient states his cough and his runny nose has kept him up at night over the last 2 nights. Denies PND, orthopnea or bilateral extremity swelling.  History obtained from patient.  No interpreter was used.  HPI  Past Medical History:  Diagnosis Date  . Hypertension    not taking BP meds  . Kidney stones   . Metacarpal bone fracture    left 5th MC  . Seizures (HCC)    none since 2009 no meds    There are no active problems to display for this patient.   Past Surgical History:  Procedure Laterality Date  . NASAL SINUS SURGERY    . OPEN REDUCTION INTERNAL FIXATION (ORIF) METACARPAL Left 11/11/2016   Procedure: OPEN TREATMENT OF LEFT FIFTH METACARPAL FRACTURE;  Surgeon: Mack Hook, MD;  Location: [facility];  Service: Orthopedics;  Laterality: Left;        Home Medications    Prior to Admission medications   Medication Sig  Start Date End Date Taking? Authorizing Provider  acetaminophen (TYLENOL) 325 MG tablet Take 2 tablets (650 mg total) by mouth every 6 (six) hours as needed for mild pain or moderate pain. 11/11/16  Yes Mack Hook, MD  dextromethorphan-guaiFENesin [facility] DM) 30-600 MG 12hr tablet Take 2 tablets by mouth 2 (two) times daily.   Yes [provider]  Dextromethorphan-guaiFENesin (ROBITUSSIN COUGH+CHEST CONG DM) 5-100 MG/5ML LIQD Take 20 mLs by mouth every 4 (four) hours as needed (cough, flu).   Yes [provider]  DM-Doxylamine-Acetaminophen (NYQUIL COLD & FLU PO) Take 1 tablet by mouth daily as needed (flu).   Yes [provider]  Doxylamine-DM (ROBITUSSIN NIGHTTIME COUGH DM) 12.5-30 MG/10ML LIQD Take 20 mLs by mouth daily as needed (cough, flu).   Yes [provider]  Multiple Vitamins-Minerals (AIRBORNE PO) Take by mouth.   Yes [provider]  naproxen sodium (ALEVE) 220 MG tablet Take 440 mg by mouth at bedtime as needed (pain).   Yes [provider]  Pseudoephedrine-APAP-DM (DAYQUIL PO) Take 1 tablet by mouth daily as needed (flu).   Yes [provider]  benzonatate (TESSALON) 200 MG capsule Take 1 capsule (200 mg total) by mouth 3 (three) times daily as needed for up to 7 days for cough. 06/12/18 06/19/18  Kayci Belleville A, PA-C  Fluticasone Furoate 50 MCG/ACT AEPB Place 1 spray into both nostrils 2 (two) times daily for 7 days. 06/12/18 06/19/18  Chontel Warning A, PA-C  guaiFENesin-codeine 100-10 MG/5ML syrup Take 10 mLs by mouth every 6 (six) hours as needed for up to 3 days for cough. 06/12/18 06/15/18  Bowman Higbie A, PA-C  HYDROcodone-acetaminophen (NORCO/VICODIN) 5-325 MG tablet Take 1 tablet by mouth every 6 (six) hours as needed for moderate pain or severe pain. Patient not taking: Reported on 06/12/2018 10/29/16   Charlestine NightLawyer, Christopher, PA-C  ibuprofen (ADVIL,MOTRIN) 600 MG tablet Take 1 tablet (600 mg total) by mouth  every 6 (six) hours as needed. Patient not taking: Reported on 06/12/2018 08/21/17   Joy, Ines BloomerShawn C, PA-C  lidocaine (LIDODERM) 5 % Place 1 patch onto the skin daily. Remove & Discard patch within 12 hours or as directed by MD Patient not taking: Reported on 06/12/2018 11/02/17   Aviva KluverMurray, Alyssa B, PA-C  methocarbamol (ROBAXIN) 500 MG tablet Take 1 tablet (500 mg total) by mouth 2 (two) times daily. Patient not taking: Reported on 06/12/2018 11/02/17   Aviva KluverMurray, Alyssa B, PA-C  naproxen (NAPROSYN) 250 MG tablet Take 1 tablet (250 mg total) by mouth 2 (two) times daily with a meal. Patient not taking: Reported on 06/12/2018 10/18/16   Everlene Farrieransie, William, PA-C  oxyCODONE (ROXICODONE) 5 MG immediate release tablet Take 1 tablet (5 mg total) by mouth every 6 (six) hours as needed for breakthrough pain. Patient not taking: Reported on 06/12/2018 11/11/16   Mack Hookhompson, David, MD  oxyCODONE-acetaminophen (PERCOCET/ROXICET) 5-325 MG tablet Take 1 tablet by mouth every 6 (six) hours as needed for severe pain. Patient not taking: Reported on 06/12/2018 12/01/17   Emi HolesLaw, Alexandra M, PA-C  predniSONE (STERAPRED UNI-PAK 21 TAB) 10 MG (21) TBPK tablet Take by mouth daily. Take 6 tabs by mouth daily  for 2 days, then 5 tabs for 2 days, then 4 tabs for 2 days, then 3 tabs for 2 days, 2 tabs for 2 days, then 1 tab by mouth daily for 2 days Patient not taking: Reported on 06/12/2018 12/01/17   Emi HolesLaw, Alexandra M, PA-C  valACYclovir (VALTREX) 1000 MG tablet Take 1 tablet (1,000 mg total) by mouth 3 (three) times daily. Patient not taking: Reported on 06/12/2018 12/01/17   Emi HolesLaw, Alexandra M, PA-C    Family History Family History  Problem Relation Age of Onset  . Heart failure Mother   . Heart failure Father     Social History Social History   Tobacco Use  . Smoking status: Never Smoker  . Smokeless tobacco: Never Used  Substance Use Topics  . Alcohol use: Yes    Comment: social  . Drug use: No     Allergies   Patient has  no known allergies.   Review of Systems Review of Systems  Constitutional: Negative for activity change, appetite change, diaphoresis, fatigue and unexpected weight change.       Subjective fever.  HENT: Positive for congestion, postnasal drip, rhinorrhea and sore throat. Negative for dental problem, drooling, ear discharge, ear pain, facial swelling, hearing loss, mouth sores, nosebleeds, sinus pressure, sinus pain, sneezing, tinnitus, trouble swallowing and voice change.   Respiratory: Positive for cough. Negative for choking, chest tightness, shortness of breath, wheezing and stridor.   Cardiovascular: Negative.   Gastrointestinal: Negative.   Genitourinary: Negative.   Musculoskeletal: Negative.   Skin: Negative.   Neurological: Negative.   All other systems reviewed and are negative.    Physical Exam Updated Vital Signs BP (!) 143/104   Pulse 87   Temp 98 F (36.7 C) (Oral)   Resp 19  Ht 5\' 9"  (1.753 m)   Wt 77.1 kg   SpO2 94%   BMI 25.10 kg/m   Physical Exam  Constitutional: He appears well-developed and well-nourished.  Non-toxic appearance. He does not have a sickly appearance. He does not appear ill. No distress.  Patient is able to speak in full sentences without difficulty.  He is texting on his phone upon entering the room in no acute distress.  HENT:  Head: Normocephalic and atraumatic.  Right Ear: Tympanic membrane, external ear and ear canal normal. No swelling or tenderness. Tympanic membrane is not scarred, not perforated, not erythematous, not retracted and not bulging. No middle ear effusion.  Left Ear: Tympanic membrane, external ear and ear canal normal. No swelling or tenderness. Tympanic membrane is not scarred, not perforated, not erythematous, not retracted and not bulging.  No middle ear effusion.  Nose: Mucosal edema and rhinorrhea present. No nose lacerations, sinus tenderness, nasal deformity, septal deviation or nasal septal hematoma. No  epistaxis.  No foreign bodies. Right sinus exhibits maxillary sinus tenderness and frontal sinus tenderness. Left sinus exhibits maxillary sinus tenderness and frontal sinus tenderness.  Mouth/Throat: Uvula is midline and mucous membranes are normal. No oral lesions. No trismus in the jaw. No uvula swelling. Posterior oropharyngeal erythema present. No oropharyngeal exudate, posterior oropharyngeal edema or tonsillar abscesses. No tonsillar exudate.  Eyes: Pupils are equal, round, and reactive to light.  Neck: Normal range of motion, full passive range of motion without pain and phonation normal. Neck supple. No neck rigidity. No edema, no erythema and normal range of motion present.  Cardiovascular: Normal rate, regular rhythm, normal heart sounds, intact distal pulses and normal pulses.  Pulmonary/Chest: Effort normal and breath sounds normal. No accessory muscle usage or stridor. No tachypnea. No respiratory distress. He has no decreased breath sounds. He has no wheezes. He has no rhonchi. He has no rales.  Abdominal: Soft. Normal appearance. He exhibits no distension and no fluid wave. There is no tenderness. There is no rigidity, no rebound and no guarding.  Musculoskeletal: Normal range of motion.  Neurological: He is alert.  Patient is able to move all extremities without difficulty or ataxia.  Skin: Skin is warm and dry. He is not diaphoretic.  No edema, erythema, ecchymosis or warmth.  Psychiatric: He has a normal mood and affect.  Nursing note and vitals reviewed.    ED Treatments / Results  Labs (all labs ordered are listed, but only abnormal results are displayed) Labs Reviewed  CBC WITH DIFFERENTIAL/PLATELET - Abnormal; Notable for the following components:      Result Value   Hemoglobin 12.6 (*)    All other components within normal limits  BASIC METABOLIC PANEL - Abnormal; Notable for the following components:   Glucose, Bld 102 (*)    Calcium 8.4 (*)    All other  components within normal limits  INFLUENZA PANEL BY PCR (TYPE A & B)    EKG None  Radiology Dg Chest 2 View  Result Date: 06/12/2018 CLINICAL DATA:  Cough and fever EXAM: CHEST - 2 VIEW COMPARISON:  November 02, 2017 FINDINGS: There is mild right upper lobe atelectasis. No edema or consolidation. The heart size and pulmonary vascularity are normal. No adenopathy. No pneumothorax. No bone lesions. IMPRESSION: Mild right upper lobe atelectasis. No edema or consolidation. Stable cardiac silhouette. Electronically Signed   By: Bretta Bang III M.D.   On: 06/12/2018 09:20    Procedures Procedures (including critical care time)  Medications Ordered  in ED Medications - No data to display   Initial Impression / Assessment and Plan / ED Course  I have reviewed the triage vital signs and the nursing notes.  Pertinent labs & imaging results that were available during my care of the patient were reviewed by me and considered in my medical decision making (see chart for details).  57 year old male who appears otherwise well presents for evaluation of generalized body aches and pains, rhinorrhea and cough.  Symptoms started approximately 5 days ago.  Has had multiple sick contacts at work with similar symptoms.  Admits to subjective fever and productive cough of white and yellow sputum.  Cough is worse when he lays down which he states it feels like his nasal production runs on the back of his throat.  Denies PND, orthopnea or bilateral lower leg swelling.  Did not receive influenza vaccine.  Patient is afebrile, nonseptic, non-ill-appearing lungs clear to auscultation bilaterally mild bilateral maxillary as well as frontal facial tenderness.  Nasal mucosa with rhinorrhea and minimal edema.  Posterior oropharynx with erythema, without edema or exudate.  Tonsils without exudate, edema or erythema.  Will obtain labs, influenza vaccine and chest x-ray and reevaluate.  Labs without leukocytosis,  influenza vaccine negative, no metabolic normalities, normal renal kidney function, chest x-ray without infiltrates, consolidation, CHF or pulmonary edema.  Symptoms most consistent with upper respiratory infection.  Discussed symptomatic management.  Patient is hemodynamically stable in no acute distress.  He does have mild hypertension in the department, he does have history of hypertension does not take medicine for this.  He is asymptomatic with no headache, vision changes, chest pain or shortness of breath.  Discussed with patient follow-up with his PCP for reevaluation.  Low suspicion for emergent pathology causing patient's symptoms at this time.  Discussed return precautions.  Patient voiced understanding is agreeable for follow-up.   Final Clinical Impressions(s) / ED Diagnoses   Final diagnoses:  Viral upper respiratory tract infection    ED Discharge Orders         Ordered    guaiFENesin-codeine 100-10 MG/5ML syrup  Every 6 hours PRN     06/12/18 0953    benzonatate (TESSALON) 200 MG capsule  3 times daily PRN     06/12/18 0953    Fluticasone Furoate 50 MCG/ACT AEPB  2 times daily     06/12/18 0953           Hannalee Castor A, PA-C 06/12/18 1010    Terrilee Files, MD 06/12/18 1742

## 2018-11-08 IMAGING — CR DG HAND COMPLETE 3+V*L*
3 series · 3 of 3 positions shown · non-contrast
Comparison: None.

CLINICAL DATA: Left hand pain since striking a door last night.
Initial encounter.

EXAM:
LEFT HAND - COMPLETE 3+ VIEW

[x hand pa left]
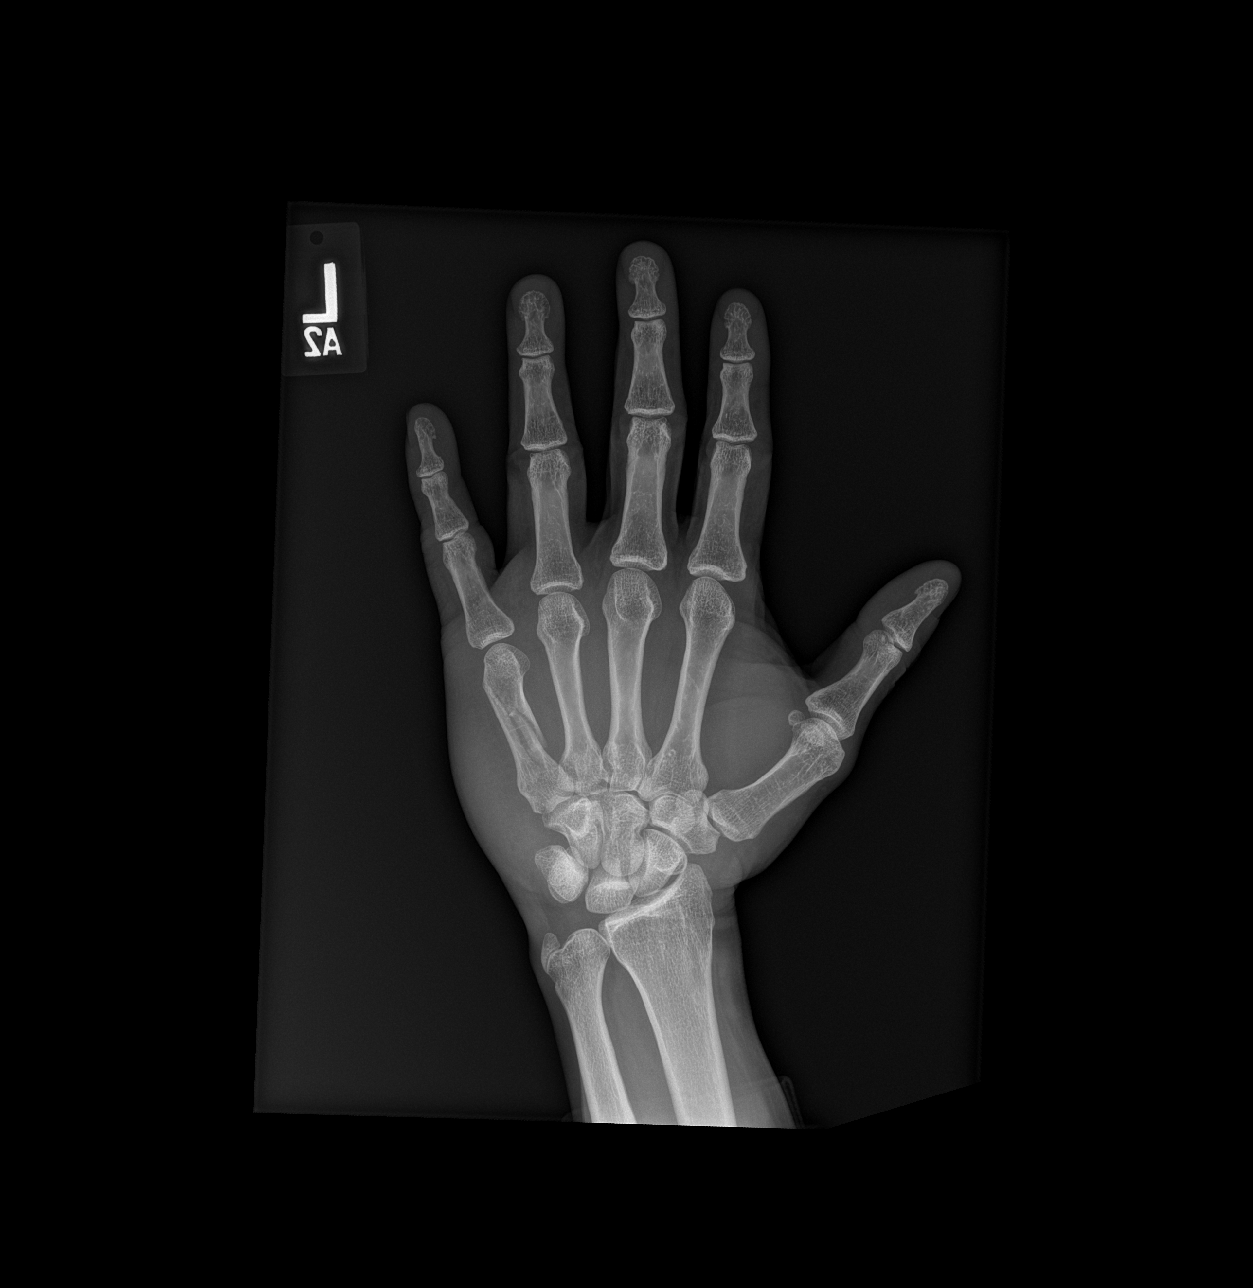

[x hand obl left]
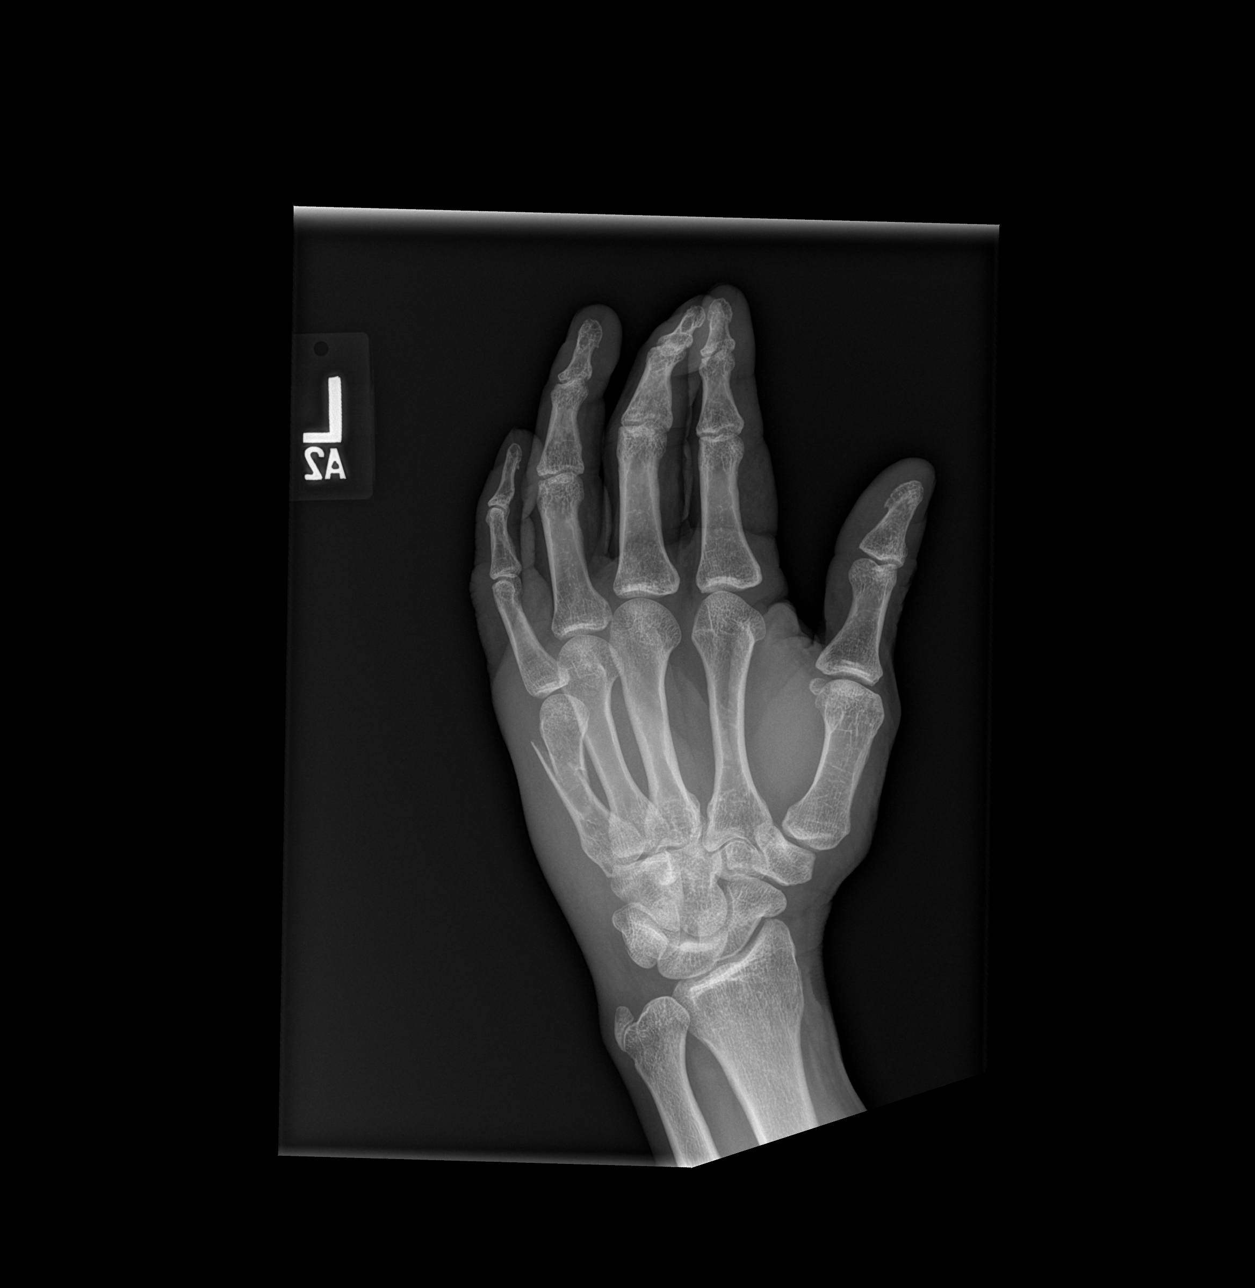

[x hand lat left]
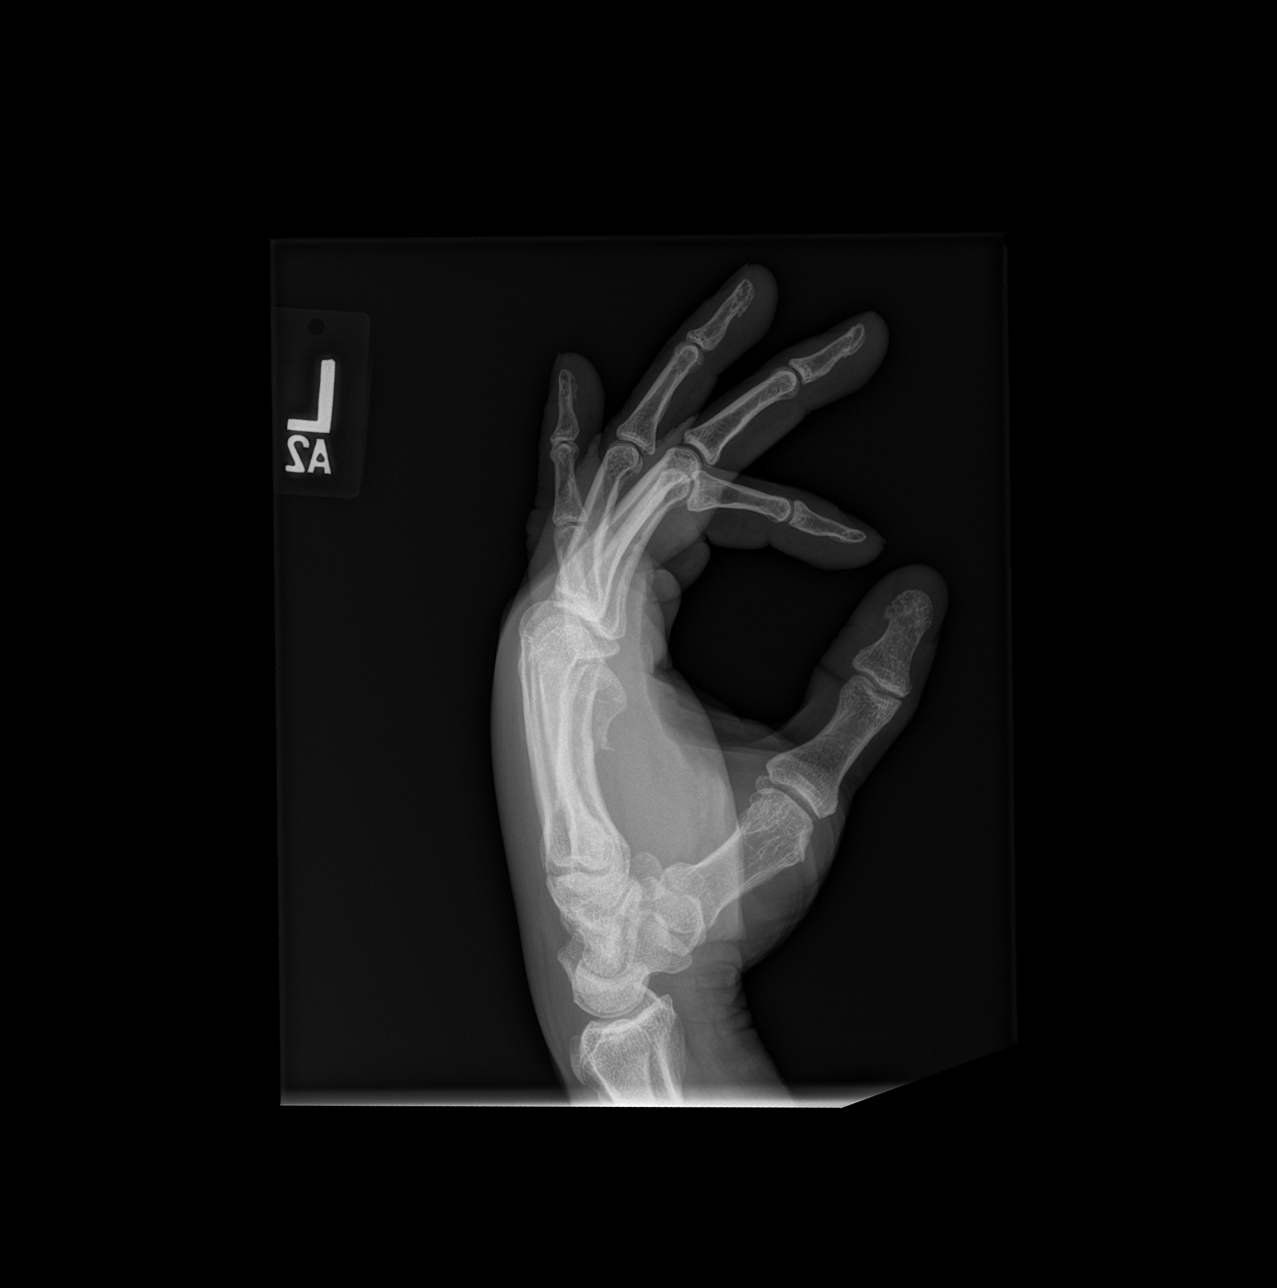

[3 of 3 positions shown; findings below may reference images not displayed]

FINDINGS: The patient has a mildly comminuted and displaced fracture of the
diaphysis of the fifth metacarpal. The fracture does not extend to
either the CMC or MCP joint. The distal fragment demonstrates mild
volar angulation. No other acute bony or joint abnormality is
identified.
IMPRESSION: Acute fifth metacarpal fracture as described.

## 2019-11-23 IMAGING — CR DG CHEST 2V
2 series · 2 of 2 positions shown · non-contrast
Comparison: 09/01/2016

CLINICAL DATA: Chest pain

EXAM:
CHEST - 2 VIEW

[w chest pa]
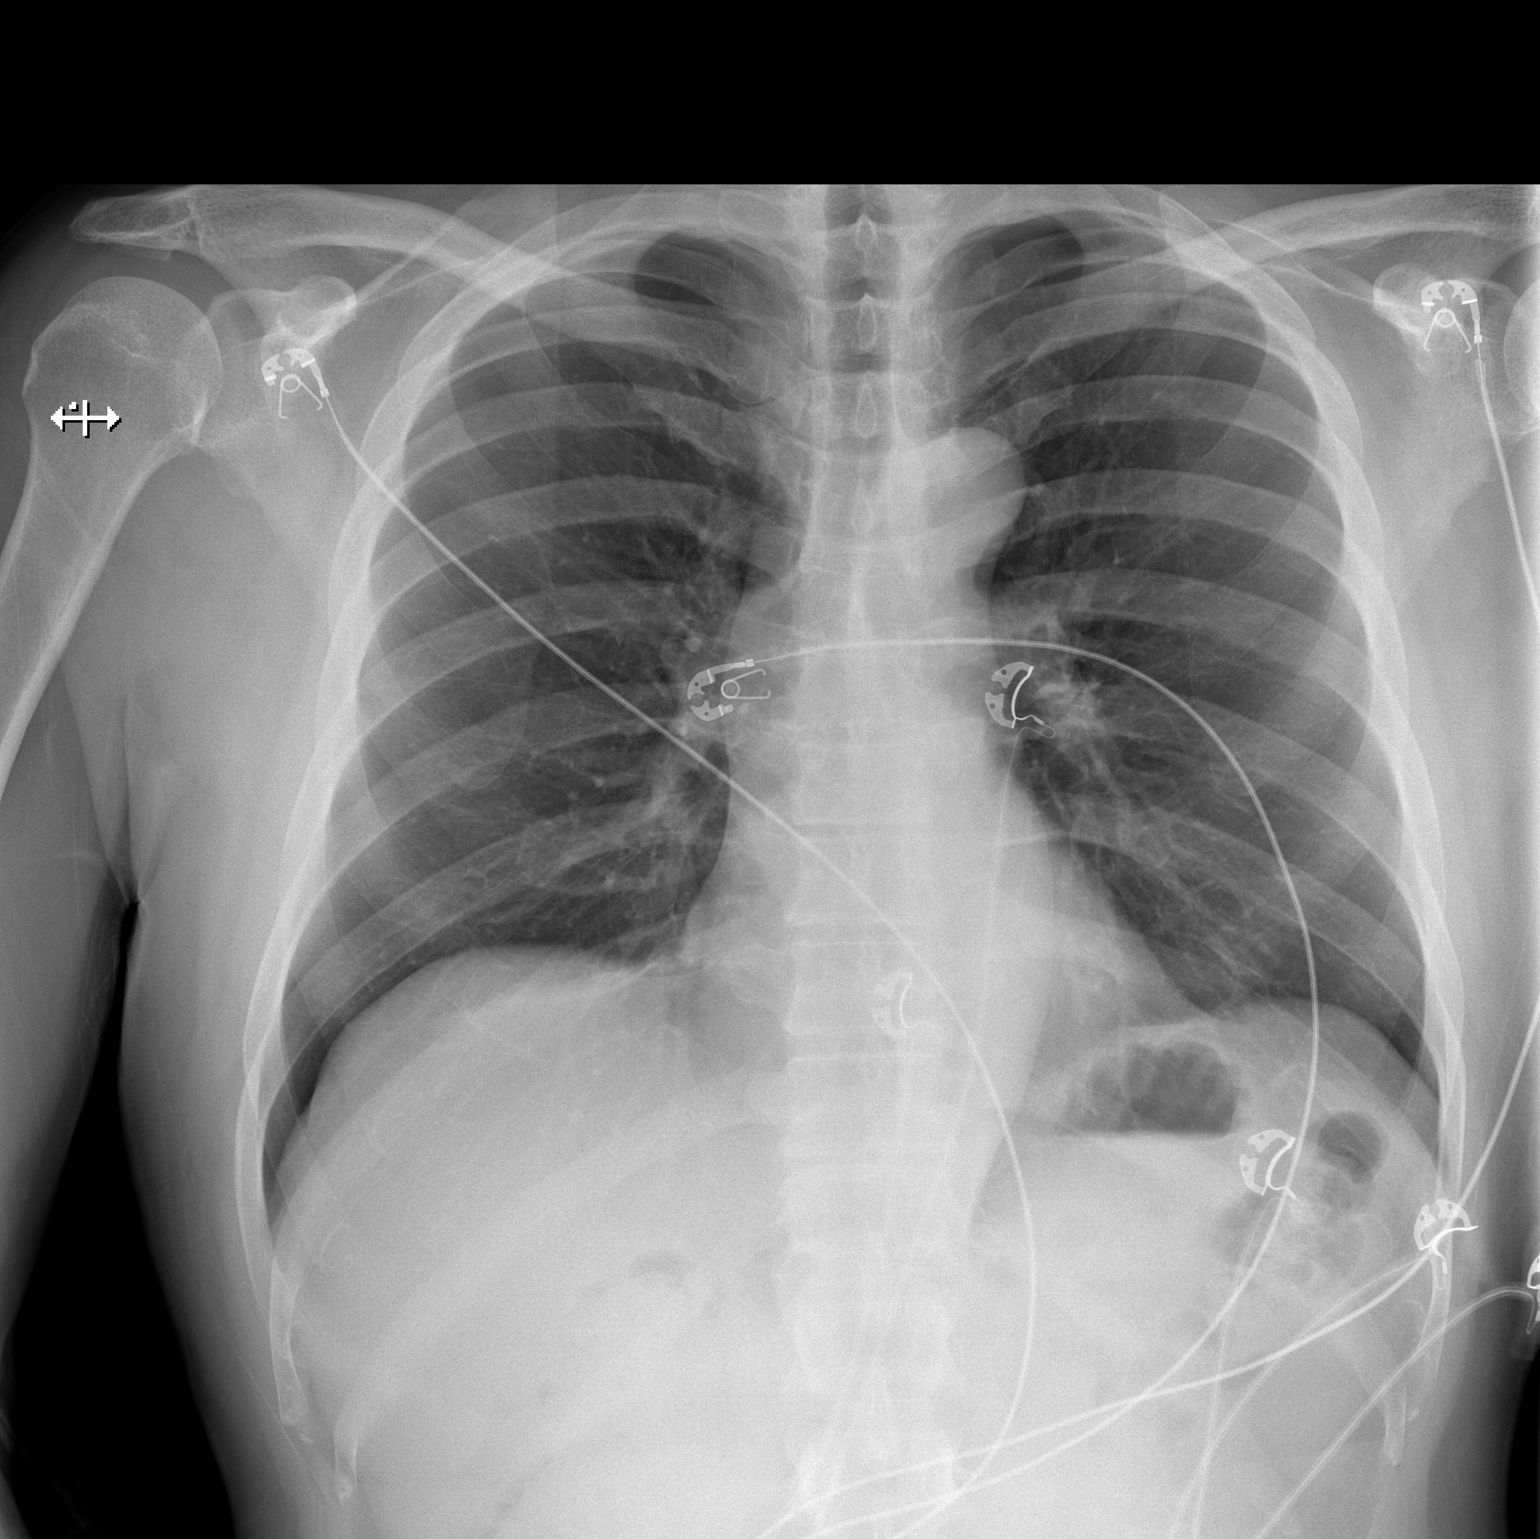

[w chest lat]
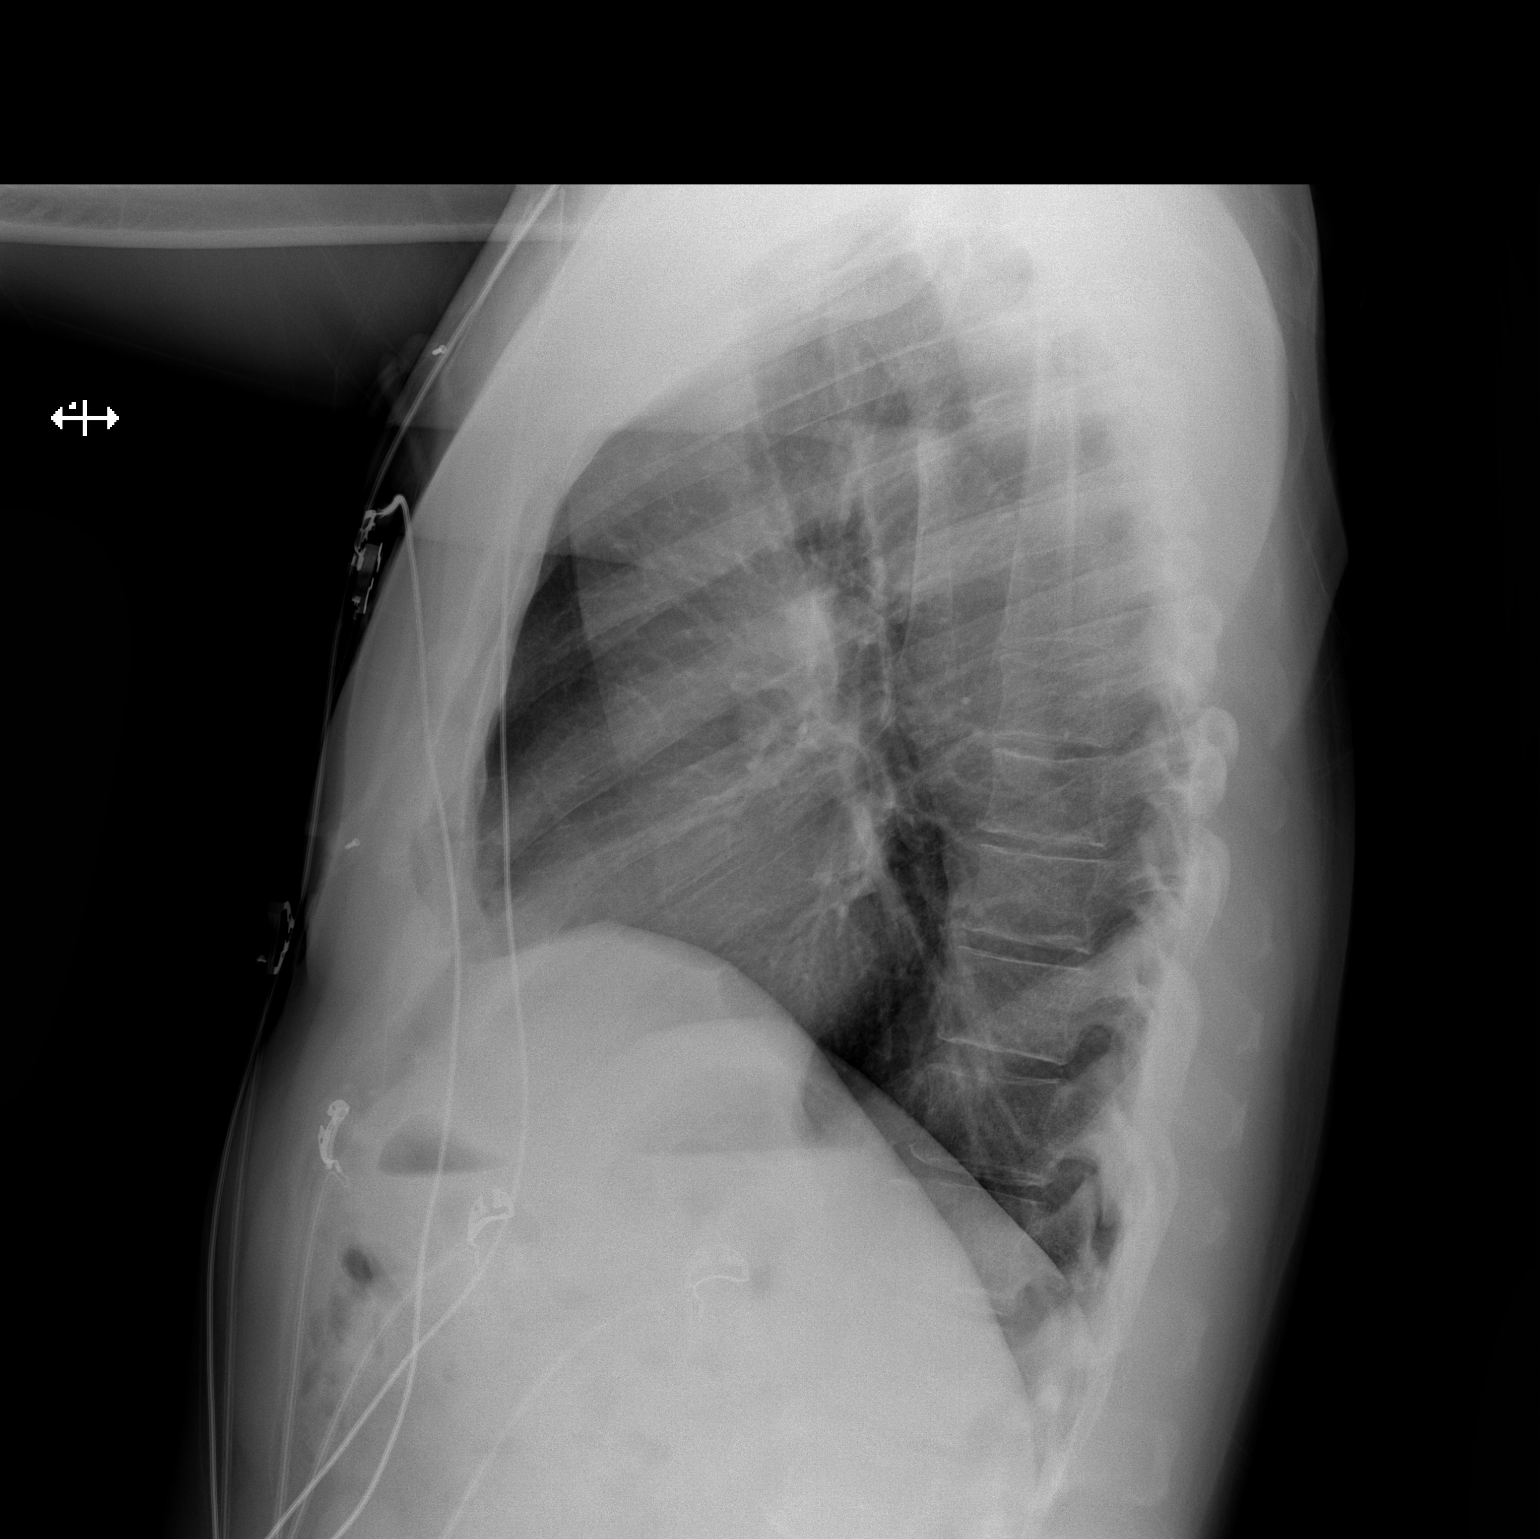

[2 of 2 positions shown; findings below may reference images not displayed]

FINDINGS: The heart size and mediastinal contours are within normal limits.
Both lungs are clear. The visualized skeletal structures are
unremarkable.
IMPRESSION: No active cardiopulmonary disease.
